# Patient Record
Sex: Male | Born: 1995 | Race: Black or African American | Hispanic: No | Marital: Single | Smoking: Current every day smoker
Health system: Southern US, Community
[De-identification: ages and names within clinical notes are randomized; demographics above are authoritative.]

## PROBLEM LIST (undated history)

## (undated) ENCOUNTER — Ambulatory Visit (HOSPITAL_COMMUNITY): Payer: Self-pay

## (undated) DIAGNOSIS — J45909 Unspecified asthma, uncomplicated: Secondary | ICD-10-CM

## (undated) HISTORY — PX: FACIAL COSMETIC SURGERY: SHX629

---
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
## 2013-07-26 ENCOUNTER — Encounter (HOSPITAL_COMMUNITY): Payer: Self-pay | Admitting: Emergency Medicine

## 2013-07-26 ENCOUNTER — Emergency Department (HOSPITAL_COMMUNITY)
Admission: EM | Admit: 2013-07-26 | Discharge: 2013-07-26 | Disposition: A | Discharge status: Home / Self Care | Payer: No Typology Code available for payment source | Attending: Emergency Medicine | Admitting: Emergency Medicine

## 2013-07-26 ENCOUNTER — Emergency Department (HOSPITAL_COMMUNITY): Payer: No Typology Code available for payment source

## 2013-07-26 DIAGNOSIS — S8990XA Unspecified injury of unspecified lower leg, initial encounter: Secondary | ICD-10-CM | POA: Insufficient documentation

## 2013-07-26 DIAGNOSIS — S335XXA Sprain of ligaments of lumbar spine, initial encounter: Secondary | ICD-10-CM | POA: Insufficient documentation

## 2013-07-26 DIAGNOSIS — S39012A Strain of muscle, fascia and tendon of lower back, initial encounter: Secondary | ICD-10-CM

## 2013-07-26 DIAGNOSIS — S99929A Unspecified injury of unspecified foot, initial encounter: Secondary | ICD-10-CM

## 2013-07-26 DIAGNOSIS — S99919A Unspecified injury of unspecified ankle, initial encounter: Secondary | ICD-10-CM

## 2013-07-26 DIAGNOSIS — Y92410 Unspecified street and highway as the place of occurrence of the external cause: Secondary | ICD-10-CM | POA: Insufficient documentation

## 2013-07-26 DIAGNOSIS — Y9389 Activity, other specified: Secondary | ICD-10-CM | POA: Insufficient documentation

## 2013-07-26 DIAGNOSIS — S0993XA Unspecified injury of face, initial encounter: Secondary | ICD-10-CM | POA: Insufficient documentation

## 2013-07-26 DIAGNOSIS — S0990XA Unspecified injury of head, initial encounter: Secondary | ICD-10-CM | POA: Insufficient documentation

## 2013-07-26 DIAGNOSIS — S199XXA Unspecified injury of neck, initial encounter: Secondary | ICD-10-CM

## 2013-07-26 MED ORDER — HYDROMORPHONE HCL PF 1 MG/ML IJ SOLN
1.0000 mg | INTRAMUSCULAR | Status: AC
  Administered 2013-07-26: 1 mg via INTRAVENOUS
  Filled 2013-07-26: qty 1

## 2013-07-26 MED ORDER — CYCLOBENZAPRINE HCL 10 MG PO TABS: 10.0000 mg | ORAL_TABLET | Freq: Two times a day (BID) | ORAL | Status: DC | PRN

## 2013-07-26 MED ORDER — OXYCODONE-ACETAMINOPHEN 5-325 MG PO TABS: 2.0000 | ORAL_TABLET | Freq: Four times a day (QID) | ORAL | Status: DC | PRN
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
## 2013-07-26 NOTE — Discharge Instructions (Signed)
Back Pain, Adult Low back pain is very common. About 1 in 5 people have back pain.The cause of low back pain is rarely dangerous. The pain often gets better over time.About half of people with a sudden onset of back pain feel better in just 2 weeks. About 8 in 10 people feel better by 6 weeks.  CAUSES Some common causes of back pain include:  Strain of the muscles or ligaments supporting the spine.  Wear and tear (degeneration) of the spinal discs.  Arthritis.  Direct injury to the back. DIAGNOSIS Most of the time, the direct cause of low back pain is not known.However, back pain can be treated effectively even when the exact cause of the pain is unknown.Answering your caregiver's questions about your overall health and symptoms is one of the most accurate ways to make sure the cause of your pain is not dangerous. If your caregiver needs more information, he or she may order lab work or imaging tests (X-rays or MRIs).However, even if imaging tests show changes in your back, this usually does not require surgery. HOME CARE INSTRUCTIONS For many people, back pain returns.Since low back pain is rarely dangerous, it is often a condition that people can learn to manageon their own.   Remain active. It is stressful on the back to sit or stand in one place. Do not sit, drive, or stand in one place for more than 30 minutes at a time. Take short walks on level surfaces as soon as pain allows.Try to increase the length of time you walk each day.  Do not stay in bed.Resting more than 1 or 2 days can delay your recovery.  Do not avoid exercise or work.Your body is made to move.It is not dangerous to be active, even though your back may hurt.Your back will likely heal faster if you return to being active before your pain is gone.  Pay attention to your body when you bend and lift. Many people have less discomfortwhen lifting if they bend their knees, keep the load close to their bodies,and  avoid twisting. Often, the most comfortable positions are those that put less stress on your recovering back.  Find a comfortable position to sleep. Use a firm mattress and lie on your side with your knees slightly bent. If you lie on your back, put a pillow under your knees.  Only take over-the-counter or prescription medicines as directed by your caregiver. Over-the-counter medicines to reduce pain and inflammation are often the most helpful.Your caregiver may prescribe muscle relaxant drugs.These medicines help dull your pain so you can more quickly return to your normal activities and healthy exercise.  Put ice on the injured area.  Put ice in a plastic bag.  Place a towel between your skin and the bag.  Leave the ice on for 15-20 minutes, 03-04 times a day for the first 2 to 3 days. After that, ice and heat may be alternated to reduce pain and spasms.  Ask your caregiver about trying back exercises and gentle massage. This may be of some benefit.  Avoid feeling anxious or stressed.Stress increases muscle tension and can worsen back pain.It is important to recognize when you are anxious or stressed and learn ways to manage it.Exercise is a great option. SEEK MEDICAL CARE IF:  You have pain that is not relieved with rest or medicine.  You have pain that does not improve in 1 week.  You have new symptoms.  You are generally not feeling well. SEEK   IMMEDIATE MEDICAL CARE IF:   You have pain that radiates from your back into your legs.  You develop new bowel or bladder control problems.  You have unusual weakness or numbness in your arms or legs.  You develop nausea or vomiting.  You develop abdominal pain.  You feel faint. Document Released: 12/22/2004 Document Revised: 06/23/2011 Document Reviewed: 04/25/2013 Exit[employer] Patient Information 2015 [employer]. This information is not intended to replace advice given to you by your health care provider. Make sure you  discuss any questions you have with your health care provider.  
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
## 2015-02-28 ENCOUNTER — Encounter (HOSPITAL_COMMUNITY): Payer: Self-pay | Admitting: *Deleted

## 2015-02-28 ENCOUNTER — Emergency Department (HOSPITAL_COMMUNITY)
Admission: EM | Admit: 2015-02-28 | Discharge: 2015-02-28 | Disposition: A | Discharge status: Home / Self Care | Payer: No Typology Code available for payment source | Attending: Emergency Medicine | Admitting: Emergency Medicine

## 2015-02-28 DIAGNOSIS — R05 Cough: Secondary | ICD-10-CM | POA: Insufficient documentation

## 2015-02-28 DIAGNOSIS — R110 Nausea: Secondary | ICD-10-CM | POA: Insufficient documentation

## 2015-02-28 DIAGNOSIS — R6889 Other general symptoms and signs: Secondary | ICD-10-CM

## 2015-02-28 DIAGNOSIS — F17200 Nicotine dependence, unspecified, uncomplicated: Secondary | ICD-10-CM | POA: Insufficient documentation

## 2015-02-28 DIAGNOSIS — R0981 Nasal congestion: Secondary | ICD-10-CM | POA: Insufficient documentation

## 2015-02-28 DIAGNOSIS — J029 Acute pharyngitis, unspecified: Secondary | ICD-10-CM | POA: Insufficient documentation

## 2015-02-28 DIAGNOSIS — M791 Myalgia: Secondary | ICD-10-CM | POA: Insufficient documentation

## 2015-02-28 LAB — RAPID STREP SCREEN (MED CTR MEBANE ONLY): STREPTOCOCCUS, GROUP A SCREEN (DIRECT): NEGATIVE

## 2015-02-28 MED ORDER — ONDANSETRON HCL 4 MG PO TABS: 4.0000 mg | ORAL_TABLET | Freq: Four times a day (QID) | ORAL | Status: DC

## 2015-02-28 NOTE — ED Notes (Signed)
Pt complains of generalized body aches, sore throat and cough since yesterday. Pt denies n/v/d. Pt states he took tylenol at 945PM.

## 2015-02-28 NOTE — ED Provider Notes (Signed)
History  By signing my name below, I, Louis Smith, attest that this documentation has been prepared under the direction and in the presence of Rob [place], [place]. Electronically Signed: Karle Smith, ED Scribe. 02/28/2015. 10:57 PM.  Chief Complaint  Patient presents with  . Sore Throat  . Generalized Body Aches   The history is provided by the patient and medical records. No language interpreter was used.    HPI Comments:  Louis Smith is a 20 y.o. male who presents to the Emergency Department complaining of congestion, sore throat and generalized body aches that began yesterday. He reports associated nausea. He has taken Mucinex and Tylenol for the symptoms with minimal relief. He denies modifying factors. He denies fever, chills, vomiting, abdominal pain, diarrhea.   History reviewed. No pertinent past medical history. History reviewed. No pertinent past surgical history. No family history on file. Social History  Substance Use Topics  . Smoking status: Current Every Day Smoker  . Smokeless tobacco: None  . Alcohol Use: Yes    Review of Systems  Constitutional: Negative for fever and chills.  HENT: Positive for sore throat.   Respiratory: Positive for cough.   Gastrointestinal: Positive for nausea. Negative for vomiting, abdominal pain and diarrhea.  Musculoskeletal: Positive for myalgias.    Allergies  Review of patient's allergies indicates no known allergies.  Home Medications   Prior to Admission medications   Medication Sig Start Date End Date Taking? Authorizing Provider  cyclobenzaprine (FLEXERIL) 10 MG tablet Take 1 tablet (10 mg total) by mouth 2 (two) times daily as needed for muscle spasms. 07/26/13   Purvis Sheffield, MD  oxyCODONE-acetaminophen (PERCOCET) 5-325 MG per tablet Take 2 tablets by mouth every 6 (six) hours as needed for moderate pain. 07/26/13   Purvis Sheffield, MD   Triage Vitals: BP 143/70 mmHg  Pulse 101  Temp(Src) 98.2 F (36.8  C) (Oral)  Resp 18  SpO2 98% Physical Exam  Physical Exam  Constitutional: Pt  is oriented to person, place, and time. Appears well-developed and well-nourished. No distress.  HENT:  Head: Normocephalic and atraumatic.  Right Ear: Tympanic membrane, external ear and ear canal normal.  Left Ear: Tympanic membrane, external ear and ear canal normal.  Nose: Mucosal edema and moderate rhinorrhea present. No epistaxis. Right sinus exhibits no maxillary sinus tenderness and no frontal sinus tenderness. Left sinus exhibits no maxillary sinus tenderness and no frontal sinus tenderness.  Mouth/Throat: Uvula is midline and mucous membranes are normal. Mucous membranes are not pale and not cyanotic. No oropharyngeal exudate, posterior oropharyngeal edema, posterior oropharyngeal erythema or tonsillar abscesses.  Eyes: Conjunctivae are normal. Pupils are equal, round, and reactive to light.  Neck: Normal range of motion and full passive range of motion without pain.  Cardiovascular: Normal rate and intact distal pulses.   Pulmonary/Chest: Effort normal and breath sounds normal. No stridor.  Clear and equal breath sounds without focal wheezes, rhonchi, rales  Abdominal: Soft. Bowel sounds are normal. There is no tenderness.  Musculoskeletal: Normal range of motion.  Lymphadenopathy:    Pthas no cervical adenopathy.  Neurological: Pt is alert and oriented to person, place, and time.  Skin: Skin is warm and dry. No rash noted. Pt is not diaphoretic.  Psychiatric: Normal mood and affect.  Nursing note and vitals reviewed.    ED Course  Procedures (including critical care time) DIAGNOSTIC STUDIES: Oxygen Saturation is 98% on RA, normal by my interpretation.   COORDINATION OF CARE: 10:55 PM- Offered Tamiflu prescription  but pt declined. Will prescribe Zofran and encouraged pt to continue taking the same OTC meds he has been taking. Encouraged pt to increase fluid intake. Pt verbalizes understanding  and agrees to plan.  Medications - No data to display  Labs Review Labs Reviewed  RAPID STREP SCREEN (NOT AT [facility])  CULTURE, GROUP A STREP [facility])    MDM   Final diagnoses:  Flu-like symptoms    Patient with symptoms consistent with influenza.  Vitals are stable, low-grade fever.  No signs of dehydration, tolerating PO's.  Lungs are clear. Due to patient's presentation and physical exam a chest x-ray was not ordered bc likely diagnosis of flu.  Patient will be discharged with instructions to orally hydrate, rest, and use over-the-counter medications such as anti-inflammatories ibuprofen and Aleve for muscle aches and Tylenol for fever.   I personally performed the services described in this documentation, which was scribed in my presence. The recorded information has been reviewed and is accurate.       Roxy Horseman, PA-C 02/28/15 2318  Tilden Fossa, MD 03/01/15 [phone]

## 2015-02-28 NOTE — Discharge Instructions (Signed)

## 2015-03-03 LAB — CULTURE, GROUP A STREP (THRC)

## 2016-01-07 ENCOUNTER — Emergency Department (HOSPITAL_COMMUNITY)
Admission: EM | Admit: 2016-01-07 | Discharge: 2016-01-07 | Disposition: A | Discharge status: Home / Self Care | Payer: Self-pay | Attending: Emergency Medicine | Admitting: Emergency Medicine

## 2016-01-07 ENCOUNTER — Encounter (HOSPITAL_COMMUNITY): Payer: Self-pay | Admitting: Emergency Medicine

## 2016-01-07 DIAGNOSIS — J029 Acute pharyngitis, unspecified: Secondary | ICD-10-CM | POA: Insufficient documentation

## 2016-01-07 DIAGNOSIS — J45909 Unspecified asthma, uncomplicated: Secondary | ICD-10-CM | POA: Insufficient documentation

## 2016-01-07 DIAGNOSIS — F17290 Nicotine dependence, other tobacco product, uncomplicated: Secondary | ICD-10-CM | POA: Insufficient documentation

## 2016-01-07 HISTORY — DX: Unspecified asthma, uncomplicated: J45.909

## 2016-01-07 MED ORDER — PENICILLIN G BENZATHINE 1200000 UNIT/2ML IM SUSP
1.2000 10*6.[IU] | Freq: Once | INTRAMUSCULAR | Status: AC
  Administered 2016-01-07: 1.2 10*6.[IU] via INTRAMUSCULAR
  Filled 2016-01-07: qty 2

## 2016-01-07 NOTE — ED Provider Notes (Signed)
[facility]-EMERGENCY DEPT Provider Note    By signing my name below, I, Earmon Phoenix, attest that this documentation has been prepared under the direction and in the presence of Sharilyn Sites, PA-C. Electronically Signed: Earmon Phoenix, ED Scribe. 01/07/16. 4:46 PM.    History   Chief Complaint Chief Complaint  Patient presents with  . Sore Throat  . Nasal Congestion   The history is provided by the patient and medical records. No language interpreter was used.    HPI Comments:  Louis Smith is a 21 y.o. male with PMHx of asthma who presents to the Emergency Department complaining of sore throat that began three days ago. He reports associated nasal congestion and sinus pressure. He reports one episode of emesis earlier today. He has has not taken anything to treat his symptoms. Swallowing increases his throat pain. Pt denies alleviating factors. He denies any known sick contacts. He denies fever, chills, nausea, difficulty breathing or swallowing.   Past Medical History:  Diagnosis Date  . Asthma     There are no active problems to display for this patient.   History reviewed. No pertinent surgical history.     Home Medications    Prior to Admission medications   Medication Sig Start Date End Date Taking? Authorizing Provider  cyclobenzaprine (FLEXERIL) 10 MG tablet Take 1 tablet (10 mg total) by mouth 2 (two) times daily as needed for muscle spasms. 07/26/13   Purvis Sheffield, MD  ondansetron (ZOFRAN) 4 MG tablet Take 1 tablet (4 mg total) by mouth every 6 (six) hours. 02/28/15   Roxy Horseman, PA-C  oxyCODONE-acetaminophen (PERCOCET) 5-325 MG per tablet Take 2 tablets by mouth every 6 (six) hours as needed for moderate pain. 07/26/13   Purvis Sheffield, MD    Family History History reviewed. No pertinent family history.  Social History Social History  Substance Use Topics  . Smoking status: Current Every Day Smoker    Types: Cigars  . Smokeless tobacco: Never  Used     Comment: 1 black and mild per day  . Alcohol use Yes     Comment: socially     Allergies   Patient has no known allergies.   Review of Systems Review of Systems  Constitutional: Negative for chills and fever.  HENT: Positive for congestion, sinus pressure and sore throat. Negative for trouble swallowing.   Respiratory: Negative for shortness of breath.   Gastrointestinal: Positive for vomiting. Negative for nausea.  All other systems reviewed and are negative.    Physical Exam Updated Vital Signs BP (!) 139/51 (BP Location: Left Arm)   Pulse 61   Temp 98.3 F (36.8 C) (Oral)   Resp 18   Wt 189 lb (85.7 kg)   SpO2 99%   Physical Exam  Constitutional: He is oriented to person, place, and time. He appears well-developed and well-nourished.  HENT:  Head: Normocephalic and atraumatic.  Right Ear: Tympanic membrane normal.  Left Ear: Tympanic membrane normal.  Nose: Nose normal.  Mouth/Throat: Uvula is midline, oropharynx is clear and moist and mucous membranes are normal.  Tonsils 1+ bilaterally with small exudates noted; uvula midline without evidence of peritonsillar abscess; handling secretions appropriately; no difficulty swallowing or speaking; normal phonation without stridor  Eyes: Conjunctivae and EOM are normal. Pupils are equal, round, and reactive to light.  Neck: Normal range of motion.  Cardiovascular: Normal rate, regular rhythm and normal heart sounds.   Pulmonary/Chest: Effort normal and breath sounds normal.  Abdominal: Soft. Bowel sounds  are normal.  Musculoskeletal: Normal range of motion.  Neurological: He is alert and oriented to person, place, and time.  Skin: Skin is warm and dry.  Psychiatric: He has a normal mood and affect.  Nursing note and vitals reviewed.    ED Treatments / Results  DIAGNOSTIC STUDIES: Oxygen Saturation is 99% on RA, normal by my interpretation.   COORDINATION OF CARE: 4:28 PM- Will order injection of Bicillin  LA prior to discharge. Return precautions discussed. Pt verbalizes understanding and agrees to plan.  Medications  penicillin g benzathine (BICILLIN LA) 1200000 UNIT/2ML injection 1.2 Million Units (not administered)    Labs (all labs ordered are listed, but only abnormal results are displayed) Labs Reviewed - No data to display  EKG  EKG Interpretation None       Radiology No results found.  Procedures Procedures (including critical care time)  Medications Ordered in ED Medications  penicillin g benzathine (BICILLIN LA) 1200000 UNIT/2ML injection 1.2 Million Units (1.2 Million Units Intramuscular Given 01/07/16 1656)     Initial Impression / Assessment and Plan / ED Course  I have reviewed the triage vital signs and the nursing notes.  Pertinent labs & imaging results that were available during my care of the patient were reviewed by me and considered in my medical decision making (see chart for details).  Clinical Course    21-year-old male here with sore throat and nasal congestion for the past several days. He is afebrile and nontoxic. Tonsils are enlarged bilaterally with exudates noted. Remainder of exam is overall benign. His is handling secretions well, normal phonation without stridor. No evidence of peritonsillar abscess at this time.  Patient treated here with Bicillin. Will discharge home with supportive care.  Follow-up with PCP.  Discussed plan with patient, he acknowledged understanding and agreed with plan of care.  Return precautions given for new or worsening symptoms.  I personally performed the services described in this documentation, which was scribed in my presence. The recorded information has been reviewed and is accurate.  Final Clinical Impressions(s) / ED Diagnoses   Final diagnoses:  Sore throat    New Prescriptions Discharge Medication List as of 01/07/2016  5:39 PM       Garlon HatchetLisa M Jaretzi Droz, PA-C 01/07/16 1755    Raeford RazorStephen Kohut, MD 01/09/16  1356

## 2016-01-07 NOTE — ED Triage Notes (Signed)
Pt c/o cough, sore throat, chills, frontal headache when coughing or sneezing x3 days. Pt tried taking mucinex w/ minimal relief. No flu shot per pt.

## 2016-01-07 NOTE — Discharge Instructions (Signed)
You have been treated for possible strep throat. May use over the counter medications if congestion type symptoms persist. Follow-up with your primary care doctor. Return here for new concerns.

## 2016-03-03 ENCOUNTER — Emergency Department (HOSPITAL_COMMUNITY)
Admission: EM | Admit: 2016-03-03 | Discharge: 2016-03-03 | Disposition: A | Discharge status: Home / Self Care | Payer: Self-pay | Attending: Emergency Medicine | Admitting: Emergency Medicine

## 2016-03-03 ENCOUNTER — Encounter (HOSPITAL_COMMUNITY): Payer: Self-pay | Admitting: Emergency Medicine

## 2016-03-03 DIAGNOSIS — F17290 Nicotine dependence, other tobacco product, uncomplicated: Secondary | ICD-10-CM | POA: Insufficient documentation

## 2016-03-03 DIAGNOSIS — J302 Other seasonal allergic rhinitis: Secondary | ICD-10-CM | POA: Insufficient documentation

## 2016-03-03 MED ORDER — BENZONATATE 100 MG PO CAPS: 100.0000 mg | ORAL_CAPSULE | Freq: Three times a day (TID) | ORAL | 0 refills | Status: DC

## 2016-03-03 NOTE — ED Triage Notes (Signed)
Patient c/o allergies that he gets every year. Nasal congestion, coughing snot.

## 2016-03-03 NOTE — Discharge Instructions (Signed)
You have been seen for symptoms that may be due to seasonal allergies or a virus.  Hydration: Symptoms will be intensified and complicated by dehydration. Dehydration can also extend the duration of symptoms. Drink plenty of fluids and get plenty of rest. You should be drinking at least half a liter of water an hour to stay hydrated. Electrolyte drinks are also encouraged. You should be drinking enough fluids to make your urine light yellow, almost clear. If this is not the case, you are not drinking enough water. Please note that some of the treatments indicated below will not be effective if you are not adequately hydrated. Pain or fever: Ibuprofen, Naproxen, or Tylenol for pain or fever.  Cough: Use the Tessalon for cough.  Congestion: Plain Mucinex may help relieve congestion. Saline sinus rinses and saline nasal sprays may also help relieve congestion. May also begin using medications like Claritin or Zyrtec daily. Flonase nasal spray is also recommended. These are all available over the counter. Sore throat: Warm liquids or Chloraseptic spray may help soothe a sore throat. Gargle twice a day with a salt water solution made from a half teaspoon of salt in a cup of warm water.  Follow up: Follow up with a primary care provider, as needed, for any future management of this issue.

## 2016-03-03 NOTE — ED Provider Notes (Signed)
[facility]-EMERGENCY DEPT Provider Note   CSN: [id number] Arrival date & time: 03/03/16  1159     History   Chief Complaint Chief Complaint  Patient presents with  . Allergies    HPI Louis Smith is a 21 y.o. male.  HPI   Louis Smith is a 21 y.o. male, with a history of Asthma, presenting to the ED with Nasal congestion, intermittently productive cough with clear sputum, and sneezing for the past couple weeks. Patient states that he gets similar symptoms every year around this time. He has not tried any medications or other treatments for his symptoms. Denies fever/chills, N/V/D, shortness of breath, sore throat, facial pain, or any other complaints.      Past Medical History:  Diagnosis Date  . Asthma     There are no active problems to display for this patient.   History reviewed. No pertinent surgical history.     Home Medications    Prior to Admission medications   Medication Sig Start Date End Date Taking? Authorizing Provider  benzonatate (TESSALON) 100 MG capsule Take 1 capsule (100 mg total) by mouth every 8 (eight) hours. 03/03/16   Shawn C Joy, PA-C  cyclobenzaprine (FLEXERIL) 10 MG tablet Take 1 tablet (10 mg total) by mouth 2 (two) times daily as needed for muscle spasms. 07/26/13   Purvis Sheffield, MD  ondansetron (ZOFRAN) 4 MG tablet Take 1 tablet (4 mg total) by mouth every 6 (six) hours. 02/28/15   Roxy Horseman, PA-C  oxyCODONE-acetaminophen (PERCOCET) 5-325 MG per tablet Take 2 tablets by mouth every 6 (six) hours as needed for moderate pain. 07/26/13   Purvis Sheffield, MD    Family History No family history on file.  Social History Social History  Substance Use Topics  . Smoking status: Current Every Day Smoker    Types: Cigars  . Smokeless tobacco: Never Used     Comment: 1 black and mild per day  . Alcohol use Yes     Comment: socially     Allergies   Patient has no known allergies.   Review of Systems Review of Systems    Constitutional: Negative for chills and fever.  HENT: Positive for congestion, rhinorrhea and sneezing. Negative for facial swelling, sinus pain, sore throat, trouble swallowing and voice change.   Respiratory: Positive for cough. Negative for shortness of breath and wheezing.   Cardiovascular: Negative for chest pain.  Gastrointestinal: Negative for abdominal pain, diarrhea, nausea and vomiting.  All other systems reviewed and are negative.    Physical Exam Updated Vital Signs BP (!) 122/110 (BP Location: Right Arm)   Pulse 89   Temp 98.3 F (36.8 C) (Oral)   Resp 16   Ht 6\' 3"  (1.905 m)   Wt 106.7 kg   SpO2 99%   BMI 29.39 kg/m   Physical Exam  Constitutional: He appears well-developed and well-nourished. No distress.  HENT:  Head: Normocephalic and atraumatic.  Nose: Mucosal edema and rhinorrhea present. Right sinus exhibits no maxillary sinus tenderness and no frontal sinus tenderness. Left sinus exhibits no maxillary sinus tenderness and no frontal sinus tenderness.  Mouth/Throat: Oropharynx is clear and moist and mucous membranes are normal.  Eyes: Conjunctivae are normal.  Neck: Neck supple.  Cardiovascular: Normal rate, regular rhythm, normal heart sounds and intact distal pulses.   Pulmonary/Chest: Effort normal and breath sounds normal. No respiratory distress.  Abdominal: Soft. There is no tenderness. There is no guarding.  Musculoskeletal: He exhibits no edema.  Lymphadenopathy:    He has no cervical adenopathy.  Neurological: He is alert.  Skin: Skin is warm and dry. He is not diaphoretic.  Psychiatric: He has a normal mood and affect. His behavior is normal.  Nursing note and vitals reviewed.    ED Treatments / Results  Labs (all labs ordered are listed, but only abnormal results are displayed) Labs Reviewed - No data to display  EKG  EKG Interpretation None       Radiology No results found.  Procedures Procedures (including critical care  time)  Medications Ordered in ED Medications - No data to display   Initial Impression / Assessment and Plan / ED Course  I have reviewed the triage vital signs and the nursing notes.  Pertinent labs & imaging results that were available during my care of the patient were reviewed by me and considered in my medical decision making (see chart for details).      Patient presents with symptoms consistent with seasonal allergies versus concurrent URI. No signs or symptoms that give suspicion for acute bacterial infection. Home management discussed, including recommendations for OTC medications. PCP follow-up recommended. Resources given. Patient voices understanding of all instructions and is comfortable with discharge.     Final Clinical Impressions(s) / ED Diagnoses   Final diagnoses:  Chronic seasonal allergic rhinitis, unspecified trigger    New Prescriptions New Prescriptions   BENZONATATE (TESSALON) 100 MG CAPSULE    Take 1 capsule (100 mg total) by mouth every 8 (eight) hours.     Anselm PancoastShawn C Joy, PA-C 03/03/16 1319    Lorre NickAnthony Allen, MD 03/04/16 1308

## 2017-05-14 ENCOUNTER — Emergency Department (HOSPITAL_COMMUNITY): Payer: Self-pay

## 2017-05-14 ENCOUNTER — Emergency Department (HOSPITAL_COMMUNITY)
Admission: EM | Admit: 2017-05-14 | Discharge: 2017-05-14 | Disposition: A | Discharge status: Home / Self Care | Payer: Self-pay | Attending: Emergency Medicine | Admitting: Emergency Medicine

## 2017-05-14 ENCOUNTER — Encounter (HOSPITAL_COMMUNITY): Payer: Self-pay | Admitting: Emergency Medicine

## 2017-05-14 DIAGNOSIS — J029 Acute pharyngitis, unspecified: Secondary | ICD-10-CM | POA: Insufficient documentation

## 2017-05-14 DIAGNOSIS — B9789 Other viral agents as the cause of diseases classified elsewhere: Secondary | ICD-10-CM | POA: Insufficient documentation

## 2017-05-14 DIAGNOSIS — R0981 Nasal congestion: Secondary | ICD-10-CM | POA: Insufficient documentation

## 2017-05-14 DIAGNOSIS — J069 Acute upper respiratory infection, unspecified: Secondary | ICD-10-CM | POA: Insufficient documentation

## 2017-05-14 DIAGNOSIS — F17290 Nicotine dependence, other tobacco product, uncomplicated: Secondary | ICD-10-CM | POA: Insufficient documentation

## 2017-05-14 LAB — GROUP A STREP BY PCR: GROUP A STREP BY PCR: NOT DETECTED

## 2017-05-14 MED ORDER — BENZONATATE 100 MG PO CAPS: 100.0000 mg | ORAL_CAPSULE | Freq: Three times a day (TID) | ORAL | 0 refills | Status: DC

## 2017-05-14 MED ORDER — MONTELUKAST SODIUM 5 MG PO CHEW: 5.0000 mg | CHEWABLE_TABLET | Freq: Every day | ORAL | 0 refills | Status: DC

## 2017-05-14 MED ORDER — CHLORHEXIDINE GLUCONATE 0.12 % MT SOLN
15.0000 mL | Freq: Two times a day (BID) | OROMUCOSAL | 0 refills | Status: DC
Start: 2017-05-14 — End: 2018-03-18

## 2017-05-14 NOTE — Discharge Instructions (Addendum)
Your strep test and your chest xray are normal.  This is likely a viral infection.  Take medications prescribed as needed.

## 2017-05-14 NOTE — ED Provider Notes (Signed)
[facility]-EMERGENCY DEPT Provider Note   CSN: [id number] Arrival date & time: 05/14/17  1451     History   Chief Complaint Chief Complaint  Patient presents with  . Sore Throat  . Cough    HPI Louis Smith is a 22 y.o. male.  HPI   22 year old male presenting for evaluation of sore throat and cough.  Patient report for the past 5 days he has had sinus congestion, throat irritation, enlarged lymph node in his neck, and cough occasionally productive with yellow sputum.  Symptoms waxing waning, no associated fever, severe headache, shortness of breath, body aches or chills.  His kids recently got over the flu.  At home using over-the-counter medication such as throat spray and Tylenol with some improvement.  Past Medical History:  Diagnosis Date  . Asthma     There are no active problems to display for this patient.   History reviewed. No pertinent surgical history.      Home Medications    Prior to Admission medications   Medication Sig Start Date End Date Taking? Authorizing Provider  acetaminophen (TYLENOL) 325 MG tablet Take 650 mg by mouth every 6 (six) hours as needed for mild pain.   Yes [provider]    Family History No family history on file.  Social History Social History   Tobacco Use  . Smoking status: Current Every Day Smoker    Types: Cigars  . Smokeless tobacco: Never Used  . Tobacco comment: 1 black and mild per day  Substance Use Topics  . Alcohol use: Yes    Comment: socially  . Drug use: Yes    Types: Marijuana     Allergies   Patient has no known allergies.   Review of Systems Review of Systems  All other systems reviewed and are negative.    Physical Exam Updated Vital Signs BP (!) 118/97 (BP Location: Left Arm)   Pulse 96   Temp 98.8 F (37.1 C) (Oral)   Resp 18   SpO2 97%   Physical Exam  Constitutional: He appears well-developed and well-nourished. No distress.  HENT:  Head:  Atraumatic.  Ears: TMs normal bilaterally Nose: Mild rhinorrhea Throat: Uvula midline no tonsillar enlargement or exudates, no trismus  Eyes: Conjunctivae are normal.  Neck: Neck supple.  Cardiovascular: Normal rate and regular rhythm.  Pulmonary/Chest: Effort normal and breath sounds normal. No stridor. He has no wheezes. He has no rhonchi. He has no rales.  Abdominal: Soft. There is no tenderness.  Lymphadenopathy:    He has no cervical adenopathy.  Neurological: He is alert.  Skin: No rash noted.  Psychiatric: He has a normal mood and affect.  Nursing note and vitals reviewed.    ED Treatments / Results  Labs (all labs ordered are listed, but only abnormal results are displayed) Labs Reviewed  GROUP A STREP BY PCR    EKG None  Radiology Dg Chest 2 View  Result Date: 05/14/2017 CLINICAL DATA:  Cough EXAM: CHEST - 2 VIEW COMPARISON:  Chest radiograph 07/26/2013 FINDINGS: The heart size and mediastinal contours are within normal limits. Both lungs are clear. The visualized skeletal structures are unremarkable. IMPRESSION: No active cardiopulmonary disease. Electronically Signed   By: Deatra Robinson M.D.   On: 05/14/2017 15:32    Procedures Procedures (including critical care time)  Medications Ordered in ED Medications - No data to display   Initial Impression / Assessment and Plan / ED Course  I have reviewed  the triage vital signs and the nursing notes.  Pertinent labs & imaging results that were available during my care of the patient were reviewed by me and considered in my medical decision making (see chart for details).     BP (!) 118/97 (BP Location: Left Arm)   Pulse 96   Temp 98.8 F (37.1 C) (Oral)   Resp 18   SpO2 97%    Final Clinical Impressions(s) / ED Diagnoses   Final diagnoses:  Viral upper respiratory tract infection    ED Discharge Orders        Ordered    chlorhexidine (PERIDEX) 0.12 % solution  2 times daily     05/14/17 1800     benzonatate (TESSALON) 100 MG capsule  Every 8 hours     05/14/17 1800    montelukast (SINGULAIR) 5 MG chewable tablet  Daily at bedtime     05/14/17 1800     Pt symptoms consistent with URI. CXR negative for acute infiltrate. Pt will be discharged with symptomatic treatment.  Discussed return precautions.  Pt is hemodynamically stable & in NAD prior to discharge.    Fayrene Helper, PA-C 05/14/17 1801    Charlynne Pander, MD 05/14/17 [id number]

## 2017-05-14 NOTE — ED Triage Notes (Signed)
Pt c/o sore throat and cough that is productive at times with yellow sputum sometimes with little blood in it. Reports his twins has strep throat.

## 2017-05-14 NOTE — ED Notes (Signed)
Pt states he left work to come here and wants a note so he can leave now and go pick up his kids.

## 2017-06-06 ENCOUNTER — Encounter (HOSPITAL_COMMUNITY): Payer: Self-pay | Admitting: Family Medicine

## 2017-06-06 ENCOUNTER — Ambulatory Visit (HOSPITAL_COMMUNITY)
Admission: EM | Admit: 2017-06-06 | Discharge: 2017-06-06 | Disposition: A | Discharge status: Home / Self Care | Payer: Self-pay | Attending: Emergency Medicine | Admitting: Emergency Medicine

## 2017-06-06 DIAGNOSIS — J069 Acute upper respiratory infection, unspecified: Secondary | ICD-10-CM

## 2017-06-06 DIAGNOSIS — B9789 Other viral agents as the cause of diseases classified elsewhere: Secondary | ICD-10-CM

## 2017-06-06 MED ORDER — PSEUDOEPH-BROMPHEN-DM 30-2-10 MG/5ML PO SYRP: 5.0000 mL | ORAL_SOLUTION | Freq: Four times a day (QID) | ORAL | 0 refills | Status: DC | PRN

## 2017-06-06 MED ORDER — OXYMETAZOLINE HCL 0.05 % NA SOLN: 1.0000 | Freq: Two times a day (BID) | NASAL | 0 refills | Status: DC

## 2017-06-06 MED ORDER — FLUTICASONE PROPIONATE 50 MCG/ACT NA SUSP: 1.0000 | Freq: Every day | NASAL | 0 refills | Status: DC

## 2017-06-06 MED ORDER — CETIRIZINE HCL 10 MG PO CAPS: 10.0000 mg | ORAL_CAPSULE | Freq: Every day | ORAL | 0 refills | Status: DC

## 2017-06-06 NOTE — ED Triage Notes (Signed)
Pt here for 3 days of headache, nasal congestion, coughing.

## 2017-06-06 NOTE — Discharge Instructions (Signed)
For congestion please begin daily allergy pill like Zyrtec or Claritin, or store brand.  Please also use Flonase nasal spray daily.  You may use Afrin nasal spray for 3 days, do not use more than 3 days as it may worsen congestion.  For cough please use cough syrup provided, or you may use over-the-counter Delsym, Robitussin or Robitussin-Dm.  I expect symptoms to gradually improve over the next 7 days.  Please return if symptoms worsening or not improving by the end of next weekend.

## 2017-06-07 NOTE — ED Provider Notes (Signed)
[facility]    CSN: [id number] Arrival date & time: 06/06/17  1242     History   Chief Complaint Chief Complaint  Patient presents with  . Nasal Congestion  . Cough    HPI Louis Smith is a 22 y.o. male history of asthma and tobacco use, Patient is presenting with URI symptoms- congestion, cough, sore throat.  Also having a headache and feeling like his ears are popped.  Patient's main complaints are congestion, headache. Symptoms have been going on for 1 day. Patient has tried Tylenol and Mucinex, with minimal relief. Denies fever, nausea, vomiting, diarrhea. Denies shortness of breath and chest pain.    HPI  Past Medical History:  Diagnosis Date  . Asthma     There are no active problems to display for this patient.   History reviewed. No pertinent surgical history.     Home Medications    Prior to Admission medications   Medication Sig Start Date End Date Taking? Authorizing Provider  acetaminophen (TYLENOL) 325 MG tablet Take 650 mg by mouth every 6 (six) hours as needed for mild pain.    [provider]  benzonatate (TESSALON) 100 MG capsule Take 1 capsule (100 mg total) by mouth every 8 (eight) hours. 05/14/17   Fayrene Helper, PA-C  brompheniramine-pseudoephedrine-DM 30-2-10 MG/5ML syrup Take 5 mLs by mouth 4 (four) times daily as needed. 06/06/17   Wieters, Hallie C, PA-C  Cetirizine HCl 10 MG CAPS Take 1 capsule (10 mg total) by mouth daily for 15 days. 06/06/17 06/21/17  Wieters, Hallie C, PA-C  chlorhexidine (PERIDEX) 0.12 % solution Use as directed 15 mLs in the mouth or throat 2 (two) times daily. 05/14/17   Fayrene Helper, PA-C  fluticasone (FLONASE) 50 MCG/ACT nasal spray Place 1-2 sprays into both nostrils daily for 7 days. 06/06/17 06/13/17  Wieters, Hallie C, PA-C  montelukast (SINGULAIR) 5 MG chewable tablet Chew 1 tablet (5 mg total) by mouth at bedtime. 05/14/17   Fayrene Helper, PA-C  oxymetazoline (AFRIN) 0.05 % nasal spray Place 1 spray into both  nostrils 2 (two) times daily. Do not use more than 3 days 06/06/17   Wieters, Junius Creamer, PA-C    Family History History reviewed. No pertinent family history.  Social History Social History   Tobacco Use  . Smoking status: Current Every Day Smoker    Types: Cigars  . Smokeless tobacco: Never Used  . Tobacco comment: 1 black and mild per day  Substance Use Topics  . Alcohol use: Yes    Comment: socially  . Drug use: Yes    Types: Marijuana     Allergies   Patient has no known allergies.   Review of Systems Review of Systems  Constitutional: Positive for fatigue. Negative for activity change, appetite change and fever.  HENT: Positive for congestion, ear pain, postnasal drip, rhinorrhea, sinus pressure and sore throat.   Eyes: Negative for pain and itching.  Respiratory: Positive for cough. Negative for shortness of breath.   Cardiovascular: Negative for chest pain.  Gastrointestinal: Negative for abdominal pain, diarrhea, nausea and vomiting.  Musculoskeletal: Negative for myalgias.  Skin: Negative for rash.  Neurological: Positive for headaches. Negative for dizziness and light-headedness.     Physical Exam Triage Vital Signs ED Triage Vitals  Enc Vitals Group     BP 06/06/17 1348 118/78     Pulse Rate 06/06/17 1348 70     Resp 06/06/17 1348 18     Temp 06/06/17 1348  98.5 F (36.9 C)     Temp src --      SpO2 06/06/17 1348 98 %     Weight --      Height --      Head Circumference --      Peak Flow --      Pain Score 06/06/17 1347 6     Pain Loc --      Pain Edu? --      Excl. in GC? --    No data found.  Updated Vital Signs BP 118/78   Pulse 70   Temp 98.5 F (36.9 C)   Resp 18   SpO2 98%   Visual Acuity Right Eye Distance:   Left Eye Distance:   Bilateral Distance:    Right Eye Near:   Left Eye Near:    Bilateral Near:     Physical Exam  Constitutional: He appears well-developed and well-nourished.  No acute distress, sounds congested    HENT:  Head: Normocephalic and atraumatic.  Bilateral ears without tenderness to palpation of external auricle, tragus and mastoid, EAC's without erythema or swelling, TM's with good bony landmarks and cone of light. Non erythematous.  Nasal mucosa erythematous, swollen turbinates, clear rhinorrhea present in bilateral nares  Oral mucosa pink and moist, no tonsillar enlargement or exudate. Posterior pharynx patent and erythematous, no uvula deviation or swelling. Normal phonation.   Eyes: Conjunctivae are normal.  Neck: Neck supple.  Cardiovascular: Normal rate and regular rhythm.  No murmur heard. Pulmonary/Chest: Effort normal and breath sounds normal. No respiratory distress.  Breathing comfortably at rest, CTABL, no wheezing, rales or other adventitious sounds auscultated  Abdominal: Soft. There is no tenderness.  Musculoskeletal: He exhibits no edema.  Neurological: He is alert.  Skin: Skin is warm and dry.  Psychiatric: He has a normal mood and affect.  Nursing note and vitals reviewed.    UC Treatments / Results  Labs (all labs ordered are listed, but only abnormal results are displayed) Labs Reviewed - No data to display  EKG None  Radiology No results found.  Procedures Procedures (including critical care time)  Medications Ordered in UC Medications - No data to display  Initial Impression / Assessment and Plan / UC Course  I have reviewed the triage vital signs and the nursing notes.  Pertinent labs & imaging results that were available during my care of the patient were reviewed by me and considered in my medical decision making (see chart for details).     Patient with URI symptoms, likely viral etiology.  Vital signs stable, exam unremarkable.  Will recommend to continue symptomatic management, recommendations below.Discussed strict return precautions. Patient verbalized understanding and is agreeable with plan.  Final Clinical Impressions(s) / UC  Diagnoses   Final diagnoses:  Viral URI with cough     Discharge Instructions     For congestion please begin daily allergy pill like Zyrtec or Claritin, or store brand.  Please also use Flonase nasal spray daily.  You may use Afrin nasal spray for 3 days, do not use more than 3 days as it may worsen congestion.  For cough please use cough syrup provided, or you may use over-the-counter Delsym, Robitussin or Robitussin-Dm.  I expect symptoms to gradually improve over the next 7 days.  Please return if symptoms worsening or not improving by the end of next weekend.   ED Prescriptions    Medication Sig Dispense Auth. Provider   Cetirizine HCl 10 MG CAPS  Take 1 capsule (10 mg total) by mouth daily for 15 days. 15 capsule Wieters, Hallie C, PA-C   fluticasone (FLONASE) 50 MCG/ACT nasal spray Place 1-2 sprays into both nostrils daily for 7 days. 1 g Wieters, Hallie C, PA-C   oxymetazoline (AFRIN) 0.05 % nasal spray Place 1 spray into both nostrils 2 (two) times daily. Do not use more than 3 days 30 mL Wieters, Hallie C, PA-C   brompheniramine-pseudoephedrine-DM 30-2-10 MG/5ML syrup Take 5 mLs by mouth 4 (four) times daily as needed. 120 mL Wieters, Hallie C, PA-C     Controlled Substance Prescriptions [place] Controlled Substance Registry consulted? Not Applicable   Lew DawesWieters, Hallie C, [place]PA-C 06/07/17 [id number]

## 2017-06-14 ENCOUNTER — Encounter (HOSPITAL_COMMUNITY): Payer: Self-pay

## 2017-06-14 ENCOUNTER — Emergency Department (HOSPITAL_COMMUNITY)
Admission: EM | Admit: 2017-06-14 | Discharge: 2017-06-14 | Disposition: A | Discharge status: Home / Self Care | Payer: Self-pay | Attending: Emergency Medicine | Admitting: Emergency Medicine

## 2017-06-14 ENCOUNTER — Emergency Department (HOSPITAL_COMMUNITY): Payer: Self-pay

## 2017-06-14 ENCOUNTER — Other Ambulatory Visit: Payer: Self-pay

## 2017-06-14 DIAGNOSIS — J40 Bronchitis, not specified as acute or chronic: Secondary | ICD-10-CM | POA: Insufficient documentation

## 2017-06-14 DIAGNOSIS — J4521 Mild intermittent asthma with (acute) exacerbation: Secondary | ICD-10-CM | POA: Insufficient documentation

## 2017-06-14 DIAGNOSIS — R059 Cough, unspecified: Secondary | ICD-10-CM

## 2017-06-14 DIAGNOSIS — Z79899 Other long term (current) drug therapy: Secondary | ICD-10-CM | POA: Insufficient documentation

## 2017-06-14 DIAGNOSIS — R05 Cough: Secondary | ICD-10-CM

## 2017-06-14 DIAGNOSIS — F17290 Nicotine dependence, other tobacco product, uncomplicated: Secondary | ICD-10-CM | POA: Insufficient documentation

## 2017-06-14 MED ORDER — DEXAMETHASONE SODIUM PHOSPHATE 10 MG/ML IJ SOLN
10.0000 mg | Freq: Once | INTRAMUSCULAR | Status: AC
  Administered 2017-06-14: 10 mg via INTRAMUSCULAR
  Filled 2017-06-14: qty 1

## 2017-06-14 MED ORDER — ALBUTEROL SULFATE HFA 108 (90 BASE) MCG/ACT IN AERS
2.0000 | INHALATION_SPRAY | Freq: Once | RESPIRATORY_TRACT | Status: AC
  Administered 2017-06-14: 2 via RESPIRATORY_TRACT
  Filled 2017-06-14: qty 6.7

## 2017-06-14 NOTE — ED Triage Notes (Signed)
Patient c/o a productive cough with yellow/green sputum and blood-tinged at times. X 1 week. Patient states he has not used his Albuterol inhaler because he couod not find it. Patient c/o asthma, but no wheezing in triage

## 2017-06-14 NOTE — Discharge Instructions (Signed)
Your symptoms are likely caused by a viral upper respiratory infection or postinfectious reactive bronchitis. Antibiotics are not helpful in treating viral infection, cough can persist for even a month after the rest of viral symptoms seem to improve. Please make sure you are drinking plenty of fluids. You can treat your symptoms supportively with tylenol/ibuprofen for fevers and pains, Zyrtec and Flonase to heal with nasal congestion, and over the counter cough syrups and throat lozenges to help with cough.  Please use your inhaler as needed for cough or wheezing, you were given a shot of steroids which should continue to help over the next few days.  If your symptoms are not improving please follow up with you Primary doctor.   If you develop persistent fevers, shortness of breath or difficulty breathing, chest pain, severe headache and neck pain, persistent nausea and vomiting or other new or concerning symptoms return to the Emergency department.

## 2017-06-14 NOTE — ED Provider Notes (Signed)
[facility]-EMERGENCY DEPT Provider Note   CSN: [id number] Arrival date & time: 06/14/17  0913     History   Chief Complaint Chief Complaint  Patient presents with  . Asthma  . Cough    HPI Louis Smith is a 22 y.o. male.  Louis Smith is a 22 y.o. Male history of asthma, who presents the emergency department for evaluation of 1 week of productive cough with yellow-green sputum, with some congestion, rhinorrhea and sore throat which is improved.  Patient reports he was seen at urgent care about 6 days ago, and has been taking Flonase, Allegra and over-the-counter cough medications as directed.  He reports the nasal congestion, rhinorrhea and sore throat has started to improve, but he has a persistent productive cough.  He reports he works in a [employer] and has been very hot in there recently and this causes repeated coughing spasms.  He reports feeling like he is wheezing intermittently especially at work, he has an albuterol nebulizer that he uses at home but not an inhaler he can take to work with him.  He reports chest soreness when he is coughing but denies any chest pain or shortness of breath otherwise.  He denies fevers reports some occasional chills.  No abdominal pain, nausea or vomiting.     Past Medical History:  Diagnosis Date  . Asthma     There are no active problems to display for this patient.   Past Surgical History:  Procedure Laterality Date  . FACIAL COSMETIC SURGERY          Home Medications    Prior to Admission medications   Medication Sig Start Date End Date Taking? Authorizing Provider  acetaminophen (TYLENOL) 325 MG tablet Take 650 mg by mouth every 6 (six) hours as needed for mild pain.    [provider]  benzonatate (TESSALON) 100 MG capsule Take 1 capsule (100 mg total) by mouth every 8 (eight) hours. 05/14/17   Fayrene Helper, PA-C  brompheniramine-pseudoephedrine-DM 30-2-10 MG/5ML syrup Take 5 mLs by mouth 4  (four) times daily as needed. 06/06/17   Wieters, Hallie C, PA-C  Cetirizine HCl 10 MG CAPS Take 1 capsule (10 mg total) by mouth daily for 15 days. 06/06/17 06/21/17  Wieters, Hallie C, PA-C  chlorhexidine (PERIDEX) 0.12 % solution Use as directed 15 mLs in the mouth or throat 2 (two) times daily. 05/14/17   Fayrene Helper, PA-C  fluticasone (FLONASE) 50 MCG/ACT nasal spray Place 1-2 sprays into both nostrils daily for 7 days. 06/06/17 06/13/17  Wieters, Hallie C, PA-C  montelukast (SINGULAIR) 5 MG chewable tablet Chew 1 tablet (5 mg total) by mouth at bedtime. 05/14/17   Fayrene Helper, PA-C  oxymetazoline (AFRIN) 0.05 % nasal spray Place 1 spray into both nostrils 2 (two) times daily. Do not use more than 3 days 06/06/17   Wieters, Junius Creamer, PA-C    Family History History reviewed. No pertinent family history.  Social History Social History   Tobacco Use  . Smoking status: Current Every Day Smoker    Types: Cigars  . Smokeless tobacco: Never Used  . Tobacco comment: 1 black and mild per day  Substance Use Topics  . Alcohol use: Yes    Comment: socially  . Drug use: Yes    Types: Marijuana     Allergies   Patient has no known allergies.   Review of Systems Review of Systems  Constitutional: Positive for chills. Negative for fever.  HENT:  Positive for congestion, postnasal drip, rhinorrhea and sore throat. Negative for ear discharge and ear pain.   Eyes: Negative for discharge, redness and itching.  Respiratory: Positive for cough and wheezing. Negative for chest tightness and shortness of breath.   Cardiovascular: Negative for chest pain.  Gastrointestinal: Negative for abdominal pain, nausea and vomiting.  Musculoskeletal: Positive for myalgias. Negative for arthralgias, neck pain and neck stiffness.  Neurological: Negative for dizziness, syncope, light-headedness and headaches.     Physical Exam Updated Vital Signs BP 125/78 (BP Location: Left Arm)   Pulse 64   Temp 98.3 F (36.8 C)  (Oral)   Resp 18   Ht 6\' 4"  (1.93 m)   Wt 113.4 kg (250 lb)   SpO2 100%   BMI 30.43 kg/m   Physical Exam  Constitutional: He appears well-developed and well-nourished. No distress.  HENT:  Head: Normocephalic and atraumatic.  Mouth/Throat: Oropharynx is clear and moist.  TMs clear with good landmarks, moderate nasal mucosa edema with clear rhinorrhea, posterior oropharynx clear and moist, with some erythema, no edema or exudates, uvula midline  Eyes: Right eye exhibits no discharge. Left eye exhibits no discharge.  Neck: Neck supple.  No rigidity  Cardiovascular: Normal rate, regular rhythm, normal heart sounds and intact distal pulses.  Pulmonary/Chest: Effort normal and breath sounds normal. No stridor. No respiratory distress. He has no wheezes. He has no rales.  Respirations equal and unlabored, patient able to speak in full sentences, lungs clear to auscultation bilaterally, they do sound a bit tight.  Active coughing repeatedly during exam.  Abdominal: Soft. Bowel sounds are normal. He exhibits no distension and no mass. There is no tenderness. There is no guarding.  Abdomen soft, nondistended, nontender to palpation in all quadrants without guarding or peritoneal signs  Musculoskeletal: He exhibits no edema or deformity.  Lymphadenopathy:    He has no cervical adenopathy.  Neurological: He is alert. Coordination normal.  Skin: Skin is warm and dry. Capillary refill takes less than 2 seconds. He is not diaphoretic.  Psychiatric: He has a normal mood and affect. His behavior is normal.  Nursing note and vitals reviewed.    ED Treatments / Results  Labs (all labs ordered are listed, but only abnormal results are displayed) Labs Reviewed - No data to display  EKG None  Radiology Dg Chest 2 View  Result Date: 06/14/2017 CLINICAL DATA:  Shortness of breath for 1 week. Chest pain last week. EXAM: CHEST - 2 VIEW COMPARISON:  PA and lateral chest 05/14/2017. Single-view of  the chest 07/26/2013. FINDINGS: The lungs are clear. Heart size is normal. No pneumothorax or pleural fluid. No bony abnormality. IMPRESSION: Negative chest. Electronically Signed   By: Drusilla Kanner M.D.   On: 06/14/2017 12:42    Procedures Procedures (including critical care time)  Medications Ordered in ED Medications  dexamethasone (DECADRON) injection 10 mg (10 mg Intramuscular Given 06/14/17 1229)  albuterol (PROVENTIL HFA;VENTOLIN HFA) 108 (90 Base) MCG/ACT inhaler 2 puff (2 puffs Inhalation Given 06/14/17 1232)     Initial Impression / Assessment and Plan / ED Course  I have reviewed the triage vital signs and the nursing notes.  Pertinent labs & imaging results that were available during my care of the patient were reviewed by me and considered in my medical decision making (see chart for details).  Pt presents with persistent cough, was experiencing some rhinorrhea nasal congestion which seems to be improving with symptomatic treatment.. Pt is well appearing and vitals are  normal. Lungs CTA on exam, do sound a bit tight, albuterol inhaler provided as well as shot of Decadron.  Pt CXR negative for acute infiltrate. Patients symptoms are consistent with URI versus postinfectious reactive bronchitis. Discussed that antibiotics are not indicated for viral infections. Pt will be discharged with symptomatic treatment, divided albuterol inhaler to take to work with him today.  Verbalizes understanding and is agreeable with plan. Pt is hemodynamically stable & in NAD prior to dc.   Final Clinical Impressions(s) / ED Diagnoses   Final diagnoses:  Bronchitis  Mild intermittent asthma with acute exacerbation  Cough    ED Discharge Orders    None       Legrand RamsFord, Kelsey N, PA-C 06/14/17 1258    Wynetta FinesMessick, Peter C, MD 06/14/17 1544

## 2017-06-14 NOTE — ED Notes (Signed)
Bed: WTR9 Expected date:  Expected time:  Means of arrival:  Comments: 

## 2017-06-14 NOTE — ED Notes (Signed)
Pt demonstrated correct use of inhaler. Pt given d/c instructions and work note.

## 2017-10-12 ENCOUNTER — Other Ambulatory Visit: Payer: Self-pay

## 2017-10-12 ENCOUNTER — Encounter (HOSPITAL_COMMUNITY): Payer: Self-pay | Admitting: Emergency Medicine

## 2017-10-12 ENCOUNTER — Ambulatory Visit (HOSPITAL_COMMUNITY)
Admission: EM | Admit: 2017-10-12 | Discharge: 2017-10-12 | Disposition: A | Discharge status: Home / Self Care | Payer: Self-pay | Attending: Family Medicine | Admitting: Family Medicine

## 2017-10-12 DIAGNOSIS — Z113 Encounter for screening for infections with a predominantly sexual mode of transmission: Secondary | ICD-10-CM

## 2017-10-12 DIAGNOSIS — R339 Retention of urine, unspecified: Secondary | ICD-10-CM | POA: Insufficient documentation

## 2017-10-12 DIAGNOSIS — Z79899 Other long term (current) drug therapy: Secondary | ICD-10-CM | POA: Insufficient documentation

## 2017-10-12 DIAGNOSIS — Z202 Contact with and (suspected) exposure to infections with a predominantly sexual mode of transmission: Secondary | ICD-10-CM

## 2017-10-12 DIAGNOSIS — J45909 Unspecified asthma, uncomplicated: Secondary | ICD-10-CM | POA: Insufficient documentation

## 2017-10-12 DIAGNOSIS — M545 Low back pain: Secondary | ICD-10-CM

## 2017-10-12 DIAGNOSIS — R300 Dysuria: Secondary | ICD-10-CM | POA: Insufficient documentation

## 2017-10-12 DIAGNOSIS — N4889 Other specified disorders of penis: Secondary | ICD-10-CM | POA: Insufficient documentation

## 2017-10-12 DIAGNOSIS — F17290 Nicotine dependence, other tobacco product, uncomplicated: Secondary | ICD-10-CM | POA: Insufficient documentation

## 2017-10-12 LAB — POCT URINALYSIS DIP (DEVICE)
BILIRUBIN URINE: NEGATIVE
Glucose, UA: NEGATIVE mg/dL
Hgb urine dipstick: NEGATIVE
KETONES UR: NEGATIVE mg/dL
LEUKOCYTES UA: NEGATIVE
Nitrite: NEGATIVE
Protein, ur: NEGATIVE mg/dL
SPECIFIC GRAVITY, URINE: 1.025 (ref 1.005–1.030)
UROBILINOGEN UA: 0.2 mg/dL (ref 0.0–1.0)
pH: 6.5 (ref 5.0–8.0)

## 2017-10-12 MED ORDER — AZITHROMYCIN 250 MG PO TABS
ORAL_TABLET | ORAL | Status: AC
  Filled 2017-10-12: qty 4

## 2017-10-12 MED ORDER — LIDOCAINE HCL (PF) 1 % IJ SOLN
INTRAMUSCULAR | Status: AC
  Filled 2017-10-12: qty 2

## 2017-10-12 MED ORDER — CEFTRIAXONE SODIUM 250 MG IJ SOLR
INTRAMUSCULAR | Status: AC
  Filled 2017-10-12: qty 250

## 2017-10-12 MED ORDER — AZITHROMYCIN 250 MG PO TABS
1000.0000 mg | ORAL_TABLET | Freq: Once | ORAL | Status: AC
  Administered 2017-10-12: 1000 mg via ORAL

## 2017-10-12 MED ORDER — CEFTRIAXONE SODIUM 250 MG IJ SOLR
250.0000 mg | Freq: Once | INTRAMUSCULAR | Status: AC
  Administered 2017-10-12: 250 mg via INTRAMUSCULAR

## 2017-10-12 NOTE — Discharge Instructions (Addendum)
We will go ahead and treat you today for gonorrhea and chlamydia. Your lab results are pending we will call with any positive results

## 2017-10-12 NOTE — ED Triage Notes (Signed)
Urinates small amounts of urine, no burning with urination, left lower back pain.  Onset of symptoms on Friday. Patient denies discharge

## 2017-10-12 NOTE — ED Notes (Signed)
Sent to bathroom for a dirty and clean specimen with instructions 

## 2017-10-12 NOTE — ED Provider Notes (Signed)
[facility]    CSN: [id number] Arrival date & time: 10/12/17  1358     History   Chief Complaint Chief Complaint  Patient presents with  . Urinary Tract Infection    HPI Louis Smith is a 22 y.o. male.   Patient is a 22 year old male presents for urinary retention, frequency, mild penile irritation and lower back discomfort. He reports symptoms  Have been constant and remained the same. He denies any rashes, lesions, penile or testicle swelling or penile discharge. He is currently sexually active with one partner, unprotected but admits to sleeping with another partner unprotected.  He denies any fever, chills, body aches, fatigue, night sweats.  ROS per HPI      Past Medical History:  Diagnosis Date  . Asthma     There are no active problems to display for this patient.   Past Surgical History:  Procedure Laterality Date  . FACIAL COSMETIC SURGERY         Home Medications    Prior to Admission medications   Medication Sig Start Date End Date Taking? Authorizing Provider  acetaminophen (TYLENOL) 325 MG tablet Take 650 mg by mouth every 6 (six) hours as needed for mild pain.    [provider]  benzonatate (TESSALON) 100 MG capsule Take 1 capsule (100 mg total) by mouth every 8 (eight) hours. 05/14/17   Fayrene Helper, PA-C  brompheniramine-pseudoephedrine-DM 30-2-10 MG/5ML syrup Take 5 mLs by mouth 4 (four) times daily as needed. 06/06/17   Wieters, Hallie C, PA-C  Cetirizine HCl 10 MG CAPS Take 1 capsule (10 mg total) by mouth daily for 15 days. 06/06/17 06/21/17  Wieters, Hallie C, PA-C  chlorhexidine (PERIDEX) 0.12 % solution Use as directed 15 mLs in the mouth or throat 2 (two) times daily. 05/14/17   Fayrene Helper, PA-C  fluticasone (FLONASE) 50 MCG/ACT nasal spray Place 1-2 sprays into both nostrils daily for 7 days. 06/06/17 06/13/17  Wieters, Hallie C, PA-C  montelukast (SINGULAIR) 5 MG chewable tablet Chew 1 tablet (5 mg total) by mouth at bedtime.  05/14/17   Fayrene Helper, PA-C  oxymetazoline (AFRIN) 0.05 % nasal spray Place 1 spray into both nostrils 2 (two) times daily. Do not use more than 3 days 06/06/17   Wieters, Junius Creamer, PA-C    Family History History reviewed. No pertinent family history.  Social History Social History   Tobacco Use  . Smoking status: Current Every Day Smoker    Types: Cigars  . Smokeless tobacco: Never Used  . Tobacco comment: 1 black and mild per day  Substance Use Topics  . Alcohol use: Yes    Comment: socially  . Drug use: Yes    Types: Marijuana     Allergies   Patient has no known allergies.   Review of Systems Review of Systems   Physical Exam Triage Vital Signs ED Triage Vitals  Enc Vitals Group     BP 10/12/17 1452 (!) 142/72     Pulse Rate 10/12/17 1452 77     Resp 10/12/17 1452 18     Temp 10/12/17 1452 98.3 F (36.8 C)     Temp Source 10/12/17 1452 Oral     SpO2 10/12/17 1452 98 %     Weight --      Height --      Head Circumference --      Peak Flow --      Pain Score 10/12/17 1450 5     Pain  Loc --      Pain Edu? --      Excl. in GC? --    No data found.  Updated Vital Signs BP (!) 142/72 (BP Location: Right Arm)   Pulse 77   Temp 98.3 F (36.8 C) (Oral)   Resp 18   SpO2 98%   Visual Acuity Right Eye Distance:   Left Eye Distance:   Bilateral Distance:    Right Eye Near:   Left Eye Near:    Bilateral Near:     Physical Exam  Constitutional: He appears well-developed and well-nourished.  Very pleasant. Non toxic or ill appearing.  HENT:  Head: Normocephalic and atraumatic.  Eyes: Conjunctivae are normal.  Neck: Normal range of motion.  Pulmonary/Chest: Effort normal.  Abdominal: Soft.  Abdomen soft, non tender. No CVA tenderness. No rebound tenderness.  Musculoskeletal: Normal range of motion.  Neurological: He is alert.  Skin: Skin is warm and dry.  Psychiatric: He has a normal mood and affect.  Nursing note and vitals reviewed.    UC  Treatments / Results  Labs (all labs ordered are listed, but only abnormal results are displayed) Labs Reviewed  POCT URINALYSIS DIP (DEVICE)  URINE CYTOLOGY ANCILLARY ONLY    EKG None  Radiology No results found.  Procedures Procedures (including critical care time)  Medications Ordered in UC Medications  cefTRIAXone (ROCEPHIN) injection 250 mg (250 mg Intramuscular Given 10/12/17 1550)  azithromycin (ZITHROMAX) tablet 1,000 mg (1,000 mg Oral Given 10/12/17 1549)    Initial Impression / Assessment and Plan / UC Course  I have reviewed the triage vital signs and the nursing notes.  Pertinent labs & imaging results that were available during my care of the patient were reviewed by me and considered in my medical decision making (see chart for details).     Most likely STD Will treat prophylactically for gonorrhea chlamydia Lab results pending We will call with any positive results Final Clinical Impressions(s) / UC Diagnoses   Final diagnoses:  Dysuria     Discharge Instructions     We will go ahead and treat you today for gonorrhea and chlamydia. Your lab results are pending we will call with any positive results    ED Prescriptions    None     Controlled Substance Prescriptions [place] Controlled Substance Registry consulted? Not Applicable   Janace Aris, NP 10/12/17 1606

## 2017-10-13 LAB — URINE CYTOLOGY ANCILLARY ONLY
CHLAMYDIA, DNA PROBE: NEGATIVE
NEISSERIA GONORRHEA: NEGATIVE
TRICH (WINDOWPATH): NEGATIVE

## 2018-03-18 ENCOUNTER — Encounter (HOSPITAL_COMMUNITY): Payer: Self-pay | Admitting: Emergency Medicine

## 2018-03-18 ENCOUNTER — Ambulatory Visit (HOSPITAL_COMMUNITY)
Admission: EM | Admit: 2018-03-18 | Discharge: 2018-03-18 | Disposition: A | Discharge status: Home / Self Care | Payer: Self-pay | Attending: Emergency Medicine | Admitting: Emergency Medicine

## 2018-03-18 ENCOUNTER — Other Ambulatory Visit: Payer: Self-pay

## 2018-03-18 DIAGNOSIS — B9789 Other viral agents as the cause of diseases classified elsewhere: Secondary | ICD-10-CM

## 2018-03-18 DIAGNOSIS — J069 Acute upper respiratory infection, unspecified: Secondary | ICD-10-CM

## 2018-03-18 MED ORDER — CETIRIZINE-PSEUDOEPHEDRINE ER 5-120 MG PO TB12: 1.0000 | ORAL_TABLET | Freq: Every day | ORAL | 0 refills | Status: DC

## 2018-03-18 MED ORDER — BENZONATATE 100 MG PO CAPS: 100.0000 mg | ORAL_CAPSULE | Freq: Three times a day (TID) | ORAL | 0 refills | Status: DC

## 2018-03-18 MED ORDER — FLUTICASONE PROPIONATE 50 MCG/ACT NA SUSP: 2.0000 | Freq: Every day | NASAL | 0 refills | Status: DC

## 2018-03-18 NOTE — ED Provider Notes (Signed)
[facility]   [id number] 03/18/18 Arrival Time: 1433   CC: URI symptoms   SUBJECTIVE: History from: patient.  LAMON HAGA is a 23 y.o. male who presents with abrupt onset of runny nose, congestion, dry/ productive cough x 3 days.  Denies known sick exposure or precipitating event.  Has tried OTC medications.  Complains of associated sweats.  Denies fever, chills, fatigue, sinus pain, sore throat, SOB, wheezing, chest pain, nausea, changes in bowel or bladder habits.    ROS: As per HPI.  Past Medical History:  Diagnosis Date  . Asthma    Past Surgical History:  Procedure Laterality Date  . FACIAL COSMETIC SURGERY     No Known Allergies No current facility-administered medications on file prior to encounter.    Current Outpatient Medications on File Prior to Encounter  Medication Sig Dispense Refill  . acetaminophen (TYLENOL) 325 MG tablet Take 650 mg by mouth every 6 (six) hours as needed for mild pain.     Social History   Socioeconomic History  . Marital status: Single    Spouse name: Not on file  . Number of children: Not on file  . Years of education: Not on file  . Highest education level: Not on file  Occupational History  . Not on file  Social Needs  . Financial resource strain: Not on file  . Food insecurity:    Worry: Not on file    Inability: Not on file  . Transportation needs:    Medical: Not on file    Non-medical: Not on file  Tobacco Use  . Smoking status: Current Every Day Smoker    Types: Cigars  . Smokeless tobacco: Never Used  . Tobacco comment: 1 black and mild per day  Substance and Sexual Activity  . Alcohol use: Yes    Comment: socially  . Drug use: Yes    Types: Marijuana  . Sexual activity: Not on file  Lifestyle  . Physical activity:    Days per week: Not on file    Minutes per session: Not on file  . Stress: Not on file  Relationships  . Social connections:    Talks on phone: Not on file    Gets together: Not  on file    Attends religious service: Not on file    Active member of club or organization: Not on file    Attends meetings of clubs or organizations: Not on file    Relationship status: Not on file  . Intimate partner violence:    Fear of current or ex partner: Not on file    Emotionally abused: Not on file    Physically abused: Not on file    Forced sexual activity: Not on file  Other Topics Concern  . Not on file  Social History Narrative  . Not on file   History reviewed. No pertinent family history.  OBJECTIVE:  Vitals:   03/18/18 1507  BP: 128/69  Pulse: 74  Resp: 18  Temp: 98.5 F (36.9 C)  TempSrc: Temporal  SpO2: 100%     General appearance: alert; appears mildly fatigued, but nontoxic; speaking in full sentences and tolerating own secretions HEENT: NCAT; Ears: EACs clear, TMs pearly gray; Eyes: PERRL.  EOM grossly intact. Nose: nares patent with clear rhinorrhea, turbinates swollen and erythematous, Throat: oropharynx clear, tonsils non erythematous or enlarged, uvula midline  Neck: supple without LAD Lungs: unlabored respirations, symmetrical air entry; cough: mild; no respiratory distress; CTAB Heart: regular rate  and rhythm.  Radial pulses 2+ symmetrical bilaterally Skin: warm and dry Psychological: alert and cooperative; normal mood and affect  ASSESSMENT & PLAN:  1. Viral URI with cough     Meds ordered this encounter  Medications  . cetirizine-pseudoephedrine (ZYRTEC-D) 5-120 MG tablet    Sig: Take 1 tablet by mouth daily.    Dispense:  30 tablet    Refill:  0    Order Specific Question:   Supervising Provider    Answer:   Eustace Moore [[id number]]  . fluticasone (FLONASE) 50 MCG/ACT nasal spray    Sig: Place 2 sprays into both nostrils daily.    Dispense:  16 g    Refill:  0    Order Specific Question:   Supervising Provider    Answer:   Eustace Moore [[id number]]  . benzonatate (TESSALON) 100 MG capsule    Sig: Take 1 capsule (100 mg  total) by mouth every 8 (eight) hours.    Dispense:  21 capsule    Refill:  0    Order Specific Question:   Supervising Provider    Answer:   Eustace Moore [[id number]]    Get plenty of rest and push fluids Tessalon Perles prescribed for cough Zyrtec-D prescribed for nasal congestion, runny nose, and/or sore throat Flonase prescribed for nasal congestion and runny nose Use medications daily for symptom relief Use OTC medications like ibuprofen or tylenol as needed fever or pain Follow up with PCP or with community health if symptoms persist Return or go to ER if you have any new or worsening symptoms fever, chills, nausea, vomiting, chest pain, cough, shortness of breath, wheezing, abdominal pain, changes in bowel or bladder habits, etc...  Reviewed expectations re: course of current medical issues. Questions answered. Outlined signs and symptoms indicating need for more acute intervention. Patient verbalized understanding. After Visit Summary given.         Rennis Harding, PA-C 03/18/18 1624

## 2018-03-18 NOTE — Discharge Instructions (Signed)
Get plenty of rest and push fluids Tessalon Perles prescribed for cough Zyrtec-D prescribed for nasal congestion, runny nose, and/or sore throat Flonase prescribed for nasal congestion and runny nose Use medications daily for symptom relief Use OTC medications like ibuprofen or tylenol as needed fever or pain Follow up with PCP or with community health if symptoms persist Return or go to ER if you have any new or worsening symptoms fever, chills, nausea, vomiting, chest pain, cough, shortness of breath, wheezing, abdominal pain, changes in bowel or bladder habits, etc..Marland Kitchen

## 2018-03-18 NOTE — ED Triage Notes (Addendum)
Pt reports nasal congestion and a strong cough x3 days.  He has taken several OTC medications with no relief.

## 2018-09-06 ENCOUNTER — Other Ambulatory Visit: Payer: Self-pay

## 2018-09-06 ENCOUNTER — Encounter (HOSPITAL_COMMUNITY): Payer: Self-pay

## 2018-09-06 ENCOUNTER — Ambulatory Visit (HOSPITAL_COMMUNITY)
Admission: EM | Admit: 2018-09-06 | Discharge: 2018-09-06 | Disposition: A | Discharge status: Home / Self Care | Payer: HRSA Program | Attending: Physician Assistant | Admitting: Physician Assistant

## 2018-09-06 DIAGNOSIS — J45909 Unspecified asthma, uncomplicated: Secondary | ICD-10-CM | POA: Insufficient documentation

## 2018-09-06 DIAGNOSIS — J069 Acute upper respiratory infection, unspecified: Secondary | ICD-10-CM | POA: Diagnosis not present

## 2018-09-06 DIAGNOSIS — Z79899 Other long term (current) drug therapy: Secondary | ICD-10-CM | POA: Insufficient documentation

## 2018-09-06 DIAGNOSIS — Z20828 Contact with and (suspected) exposure to other viral communicable diseases: Secondary | ICD-10-CM | POA: Diagnosis not present

## 2018-09-06 DIAGNOSIS — F17290 Nicotine dependence, other tobacco product, uncomplicated: Secondary | ICD-10-CM | POA: Insufficient documentation

## 2018-09-06 NOTE — ED Triage Notes (Signed)
Patient presents to Urgent Care with complaints of nasal congestion and dry cough since 2-3 days ago. 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 Pt states his kids have had runny noses recently too.

## 2018-09-06 NOTE — Discharge Instructions (Signed)
Return if any problems.

## 2018-09-06 NOTE — ED Provider Notes (Signed)
[facility]    CSN: [id number] Arrival date & time: 09/06/18  1112      History   Chief Complaint Chief Complaint  Patient presents with  . Nasal Congestion    HPI Louis Smith is a 23 y.o. male.   The history is provided by the patient. No language interpreter was used.  Cough Cough characteristics:  Productive Sputum characteristics:  Nondescript Severity:  Moderate Onset quality:  Gradual Duration:  3 days Timing:  Constant Progression:  Worsening Chronicity:  New Smoker: no   Relieved by:  Nothing Worsened by:  Nothing Ineffective treatments:  None tried Associated symptoms: rhinorrhea     Past Medical History:  Diagnosis Date  . Asthma     There are no active problems to display for this patient.   Past Surgical History:  Procedure Laterality Date  . FACIAL COSMETIC SURGERY         Home Medications    Prior to Admission medications   Medication Sig Start Date End Date Taking? Authorizing Provider  acetaminophen (TYLENOL) 325 MG tablet Take 650 mg by mouth every 6 (six) hours as needed for mild pain.    [provider]  benzonatate (TESSALON) 100 MG capsule Take 1 capsule (100 mg total) by mouth every 8 (eight) hours. 03/18/18   Wurst, [place], PA-C  cetirizine-pseudoephedrine (ZYRTEC-D) 5-120 MG tablet Take 1 tablet by mouth daily. 03/18/18   Wurst, [place], PA-C  fluticasone (FLONASE) 50 MCG/ACT nasal spray Place 2 sprays into both nostrils daily. 03/18/18   Rennis HardingWurst, Brittany, PA-C    Family History Family History  Problem Relation Age of Onset  . Healthy Mother   . Healthy Father     Social History Social History   Tobacco Use  . Smoking status: Current Every Day Smoker    Types: Cigars  . Smokeless tobacco: Never Used  . Tobacco comment: 1 black and mild per day  Substance Use Topics  . Alcohol use: Yes    Comment: socially  . Drug use: Yes    Types: Marijuana     Allergies   Patient has no known  allergies.   Review of Systems Review of Systems  HENT: Positive for rhinorrhea.   Respiratory: Positive for cough.   All other systems reviewed and are negative.    Physical Exam Triage Vital Signs ED Triage Vitals  Enc Vitals Group     BP 09/06/18 1216 138/86     Pulse Rate 09/06/18 1216 74     Resp 09/06/18 1216 17     Temp 09/06/18 1216 98.4 F (36.9 C)     Temp Source 09/06/18 1216 Oral     SpO2 09/06/18 1216 99 %     Weight --      Height --      Head Circumference --      Peak Flow --      Pain Score 09/06/18 1214 7     Pain Loc --      Pain Edu? --      Excl. in [place]? --    No data found.  Updated Vital Signs BP 138/86 (BP Location: Right Arm)   Pulse 74   Temp 98.4 F (36.9 C) (Oral)   Resp 17   SpO2 99%   Visual Acuity Right Eye Distance:   Left Eye Distance:   Bilateral Distance:    Right Eye Near:   Left Eye Near:    Bilateral Near:  Physical Exam Vitals signs and nursing note reviewed.  Constitutional:      Appearance: He is well-developed.  HENT:     Head: Normocephalic and atraumatic.     Nose: Nose normal.     Mouth/Throat:     Mouth: Mucous membranes are moist.  Eyes:     Conjunctiva/sclera: Conjunctivae normal.  Neck:     Musculoskeletal: Neck supple.  Cardiovascular:     Rate and Rhythm: Normal rate and regular rhythm.     Heart sounds: No murmur.  Pulmonary:     Effort: Pulmonary effort is normal. No respiratory distress.     Breath sounds: Normal breath sounds.  Abdominal:     Palpations: Abdomen is soft.     Tenderness: There is no abdominal tenderness.  Skin:    General: Skin is warm and dry.  Neurological:     General: No focal deficit present.     Mental Status: He is alert.  Psychiatric:        Mood and Affect: Mood normal.      UC Treatments / Results  Labs (all labs ordered are listed, but only abnormal results are displayed) Labs Reviewed  NOVEL CORONAVIRUS, NAA (HOSP ORDER, SEND-OUT TO REF LAB; TAT  18-24 HRS)    EKG   Radiology No results found.  Procedures Procedures (including critical care time)  Medications Ordered in UC Medications - No data to display  Initial Impression / Assessment and Plan / UC Course  I have reviewed the triage vital signs and the nursing notes.  Pertinent labs & imaging results that were available during my care of the patient were reviewed by me and considered in my medical decision making (see chart for details).    MDM  Pt has symptoms consistent with COvid.  Pt works in [employer] care.  Pt is tested every week at work.  He reports he was negative last week.  Final Clinical Impressions(s) / UC Diagnoses   Final diagnoses:  URI, acute     Discharge Instructions     Return if any problems.    ED Prescriptions    None     Controlled Substance Prescriptions [place] Controlled Substance Registry consulted? Not Applicable  An After Visit Summary was printed and given to the patient.    Fransico Meadow, [place] 09/06/18 1323

## 2018-09-07 LAB — NOVEL CORONAVIRUS, NAA (HOSP ORDER, SEND-OUT TO REF LAB; TAT 18-24 HRS): SARS-CoV-2, NAA: NOT DETECTED

## 2018-09-08 ENCOUNTER — Encounter (HOSPITAL_COMMUNITY): Payer: Self-pay

## 2018-09-14 IMAGING — CR DG CHEST 2V
2 series · 2 of 2 positions shown · non-contrast
Comparison: PA and lateral chest 05/14/2017. Single-view of the
chest 07/26/2013.

CLINICAL DATA: Shortness of breath for 1 week. Chest pain last
week.

EXAM:
CHEST - 2 VIEW

[w chest pa]
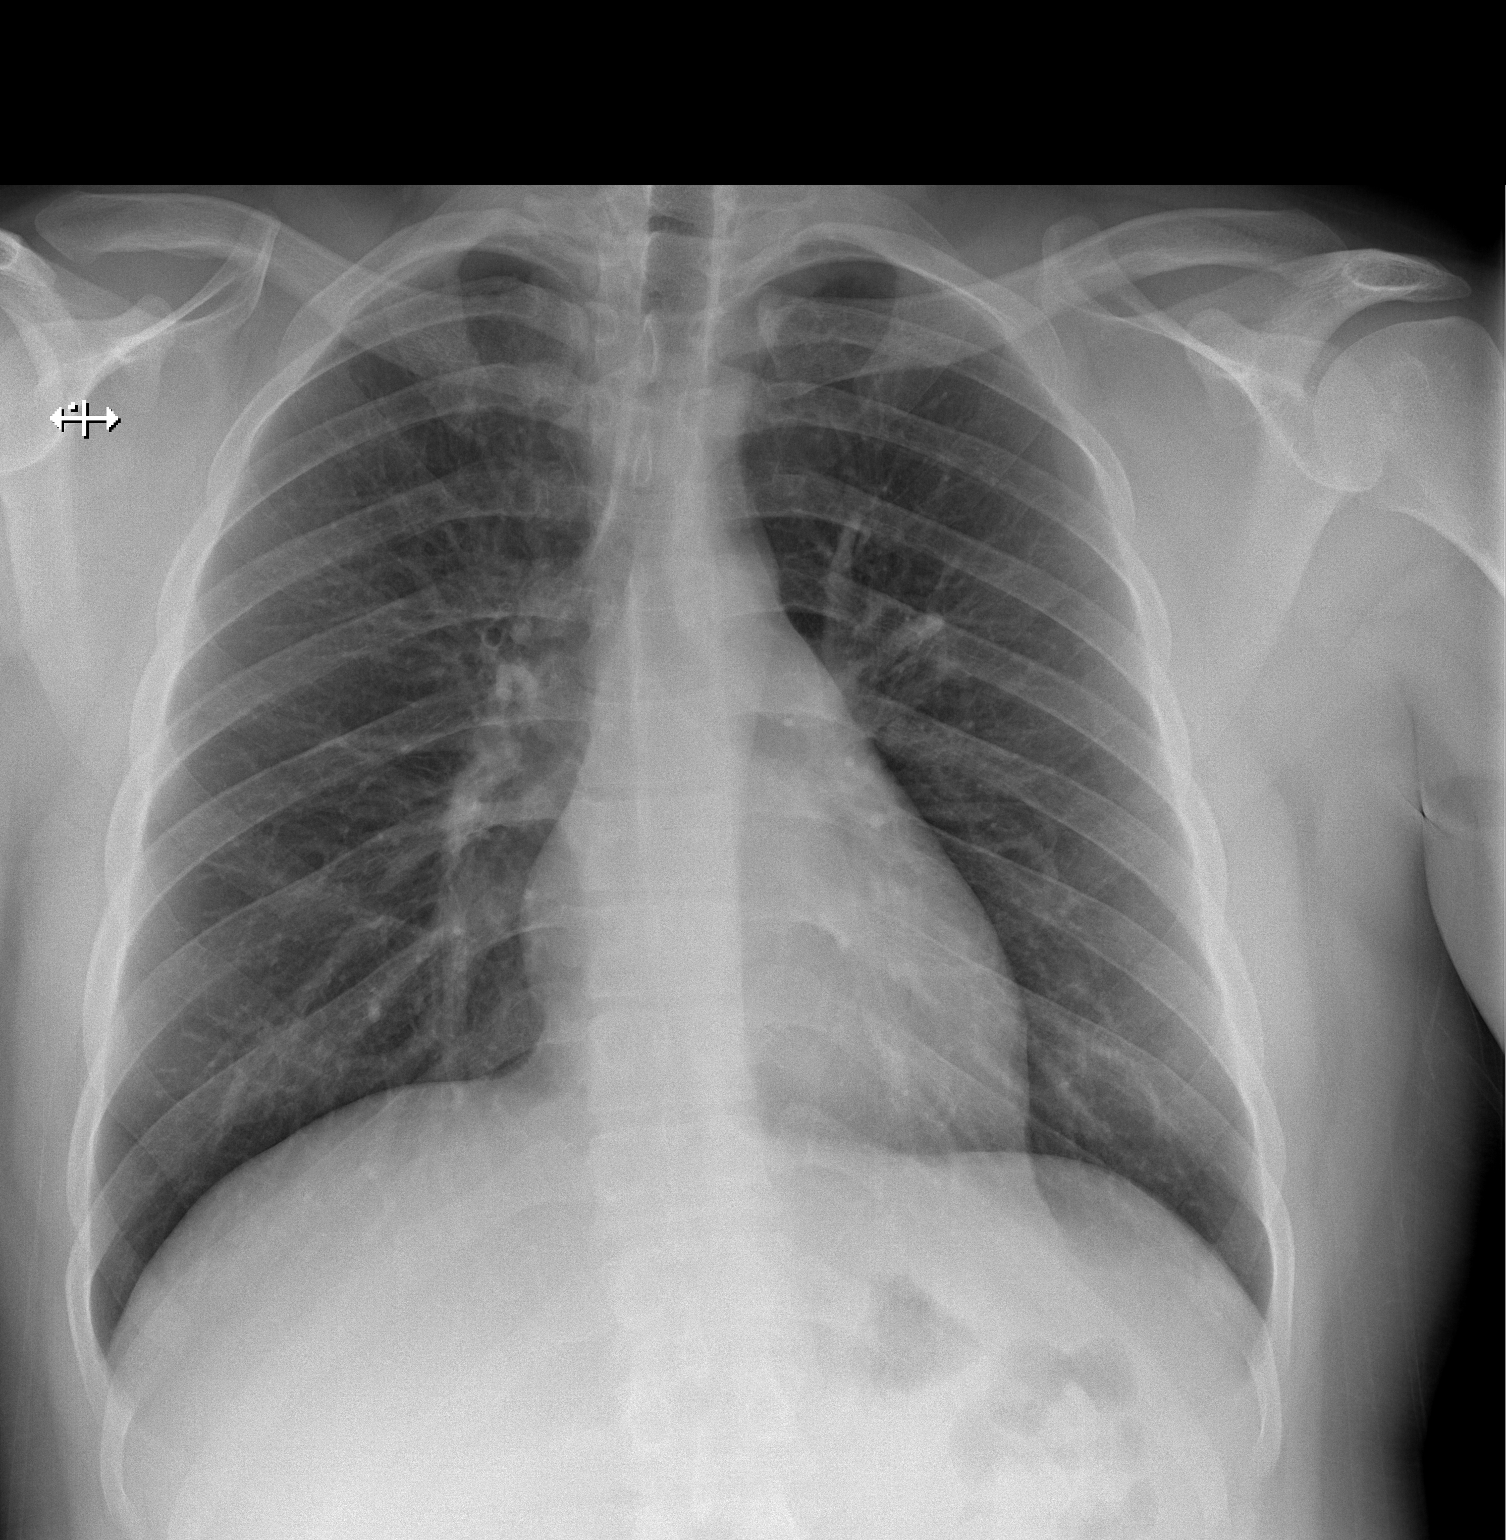

[w chest lat]
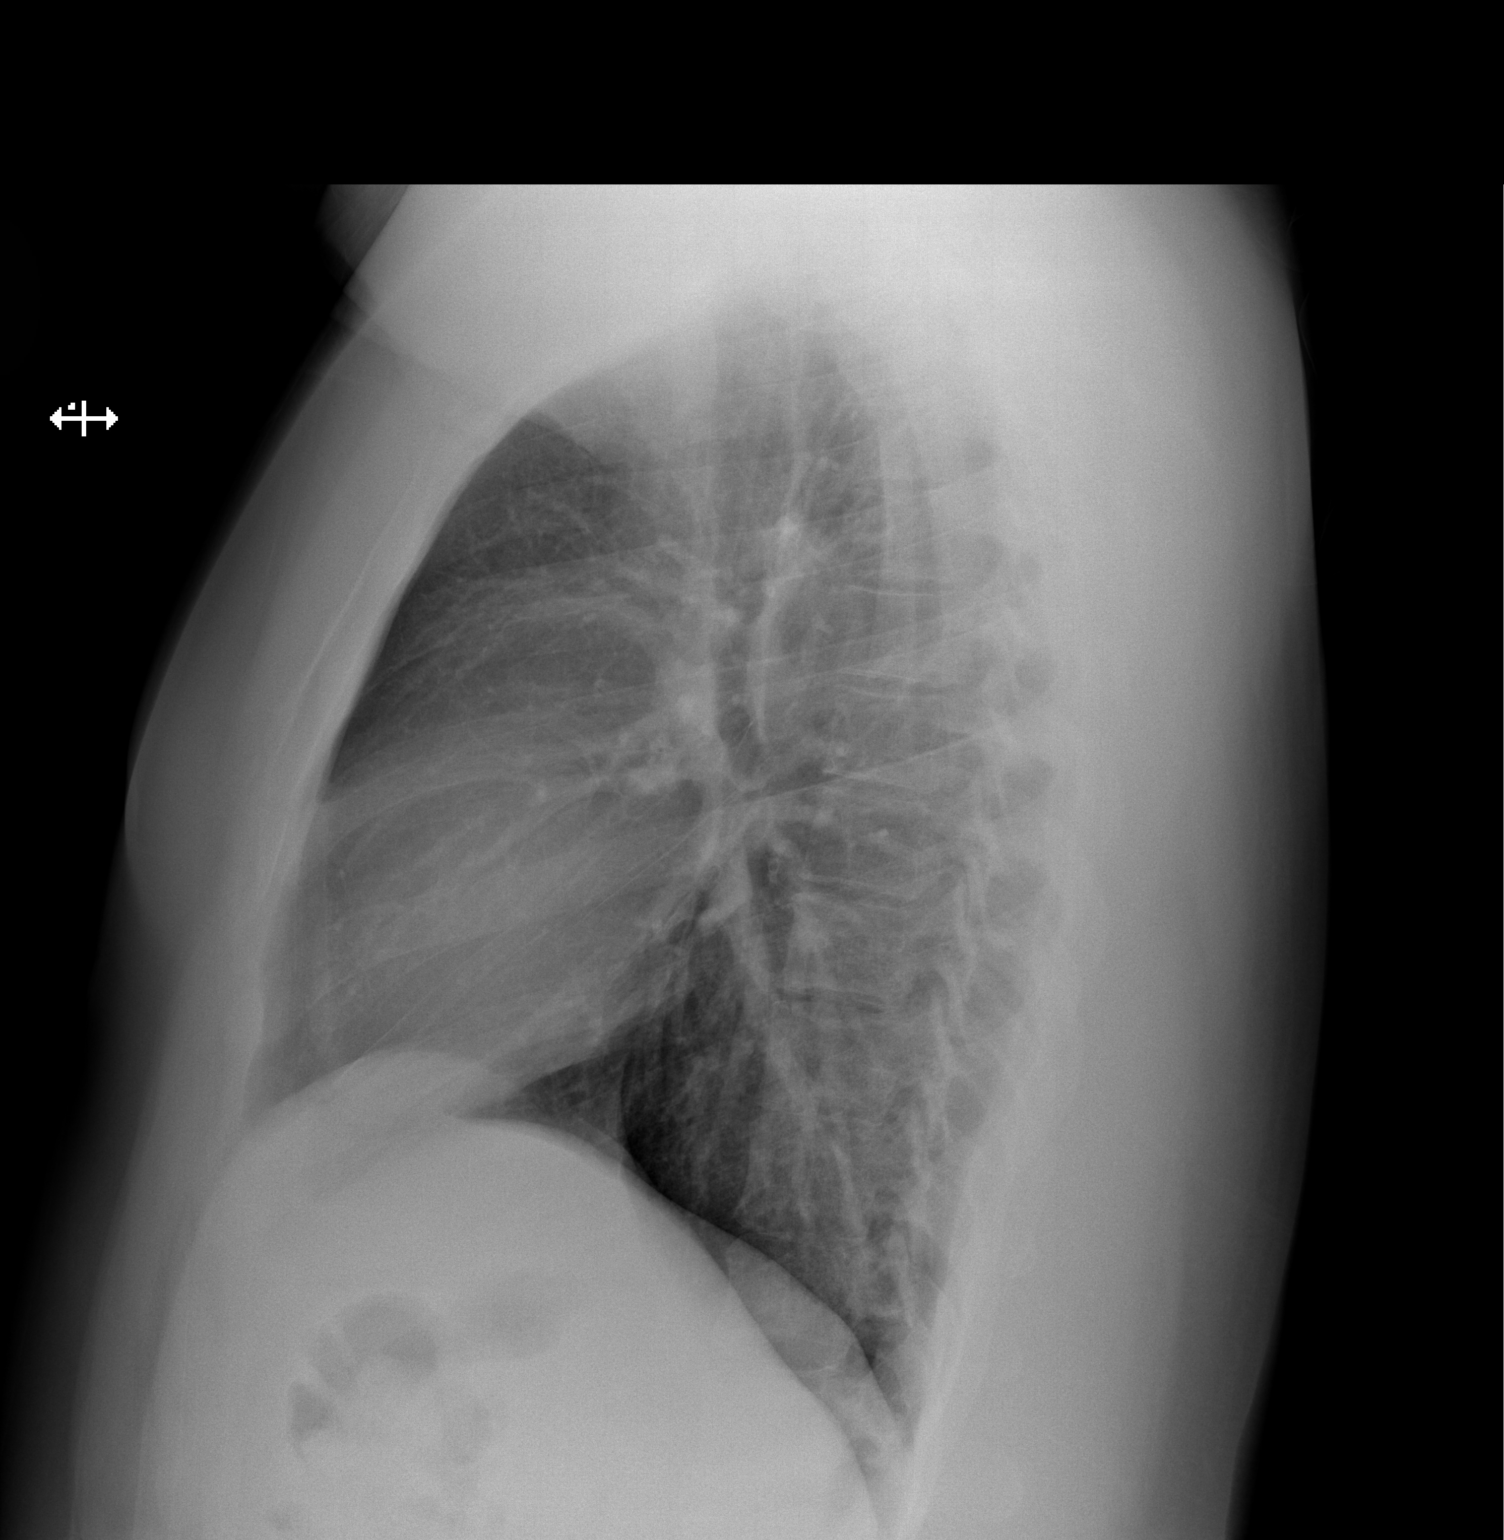

[2 of 2 positions shown; findings below may reference images not displayed]

FINDINGS: The lungs are clear. Heart size is normal. No pneumothorax or
pleural fluid. No bony abnormality.
IMPRESSION: Negative chest.

## 2018-09-29 ENCOUNTER — Other Ambulatory Visit: Payer: Self-pay

## 2018-09-29 ENCOUNTER — Encounter (HOSPITAL_COMMUNITY): Payer: Self-pay

## 2018-09-29 ENCOUNTER — Ambulatory Visit (HOSPITAL_COMMUNITY)
Admission: EM | Admit: 2018-09-29 | Discharge: 2018-09-29 | Disposition: A | Discharge status: Home / Self Care | Payer: Self-pay | Attending: Emergency Medicine | Admitting: Emergency Medicine

## 2018-09-29 DIAGNOSIS — R509 Fever, unspecified: Secondary | ICD-10-CM

## 2018-09-29 MED ORDER — FLUTICASONE PROPIONATE 50 MCG/ACT NA SUSP: 1.0000 | Freq: Every day | NASAL | 0 refills | Status: AC

## 2018-09-29 NOTE — Discharge Instructions (Signed)
May try flonase nasal spray to help with congestion

## 2018-09-29 NOTE — ED Provider Notes (Signed)
[facility]    CSN: [id number] Arrival date & time: 09/29/18  1430      History   Chief Complaint Chief Complaint  Patient presents with  . work note    HPI Louis Smith is a 23 y.o. male history of asthma presenting today for evaluation of fever.  Yesterday when he went into work he was noted to have a fever of 100.2.  He states that he had just gotten out of his hot car and was wearing multiple layers along with a hat.  States that within 10 minutes repeat temperatures were 99.1.  He has not had any symptoms.  Denies cough, congestion, sore throat.  Denies shortness of breath or chest pain.  Denies nausea or vomiting.  Had a URI 2 weeks ago, but said the symptoms have resolved.  Denies known exposure to COVID.  Requesting work note.  HPI  Past Medical History:  Diagnosis Date  . Asthma     There are no active problems to display for this patient.   Past Surgical History:  Procedure Laterality Date  . FACIAL COSMETIC SURGERY         Home Medications    Prior to Admission medications   Medication Sig Start Date End Date Taking? Authorizing Provider  acetaminophen (TYLENOL) 325 MG tablet Take 650 mg by mouth every 6 (six) hours as needed for mild pain.    [provider]  benzonatate (TESSALON) 100 MG capsule Take 1 capsule (100 mg total) by mouth every 8 (eight) hours. 03/18/18   Wurst, [place], PA-C  cetirizine-pseudoephedrine (ZYRTEC-D) 5-120 MG tablet Take 1 tablet by mouth daily. 03/18/18   Wurst, [place], PA-C  fluticasone (FLONASE) 50 MCG/ACT nasal spray Place 1-2 sprays into both nostrils daily for 7 days. 09/29/18 10/06/18  Wieters, Junius Creamer, PA-C    Family History Family History  Problem Relation Age of Onset  . Healthy Mother   . Healthy Father     Social History Social History   Tobacco Use  . Smoking status: Current Every Day Smoker    Types: Cigars  . Smokeless tobacco: Never Used  . Tobacco comment: 1 black and mild per  day  Substance Use Topics  . Alcohol use: Yes    Comment: socially  . Drug use: Yes    Types: Marijuana     Allergies   Patient has no known allergies.   Review of Systems Review of Systems  Constitutional: Positive for fever. Negative for activity change, appetite change, chills and fatigue.  HENT: Negative for congestion, ear pain, rhinorrhea, sinus pressure, sore throat and trouble swallowing.   Eyes: Negative for discharge and redness.  Respiratory: Negative for cough, chest tightness and shortness of breath.   Cardiovascular: Negative for chest pain.  Gastrointestinal: Negative for abdominal pain, diarrhea, nausea and vomiting.  Musculoskeletal: Negative for myalgias.  Skin: Negative for rash.  Neurological: Negative for dizziness, light-headedness and headaches.     Physical Exam Triage Vital Signs ED Triage Vitals  Enc Vitals Group     BP 09/29/18 1452 (!) 149/111     Pulse Rate 09/29/18 1452 89     Resp 09/29/18 1452 16     Temp 09/29/18 1452 98.3 F (36.8 C)     Temp Source 09/29/18 1452 Tympanic     SpO2 09/29/18 1452 98 %     Weight 09/29/18 1454 250 lb (113.4 kg)     Height --      Head Circumference --  Peak Flow --      Pain Score 09/29/18 1533 0     Pain Loc --      Pain Edu? --      Excl. in [place]? --    No data found.  Updated Vital Signs BP (!) 149/111 (BP Location: Left Arm)   Pulse 89   Temp 98.3 F (36.8 C) (Tympanic)   Resp 16   Wt 250 lb (113.4 kg)   SpO2 98%   BMI 30.43 kg/m   Visual Acuity Right Eye Distance:   Left Eye Distance:   Bilateral Distance:    Right Eye Near:   Left Eye Near:    Bilateral Near:     Physical Exam Vitals signs and nursing note reviewed.  Constitutional:      Appearance: He is well-developed.  HENT:     Head: Normocephalic and atraumatic.     Ears:     Comments: Bilateral ears without tenderness to palpation of external auricle, tragus and mastoid, EAC's without erythema or swelling, TM's  with good bony landmarks and cone of light. Non erythematous.  Inferior aspect of TM appears slightly opaque as of congestion present posteriorly.     Mouth/Throat:     Comments: Oral mucosa pink and moist, no tonsillar enlargement or exudate. Posterior pharynx patent and nonerythematous, no uvula deviation or swelling. Normal phonation. Eyes:     Conjunctiva/sclera: Conjunctivae normal.  Neck:     Musculoskeletal: Neck supple.  Cardiovascular:     Rate and Rhythm: Normal rate and regular rhythm.     Heart sounds: No murmur.  Pulmonary:     Effort: Pulmonary effort is normal. No respiratory distress.     Breath sounds: Normal breath sounds.     Comments: Breathing comfortably at rest, CTABL, no wheezing, rales or other adventitious sounds auscultated Abdominal:     Palpations: Abdomen is soft.     Tenderness: There is no abdominal tenderness.  Skin:    General: Skin is warm and dry.  Neurological:     Mental Status: He is alert.      UC Treatments / Results  Labs (all labs ordered are listed, but only abnormal results are displayed) Labs Reviewed - No data to display  EKG   Radiology No results found.  Procedures Procedures (including critical care time)  Medications Ordered in UC Medications - No data to display  Initial Impression / Assessment and Plan / UC Course  I have reviewed the triage vital signs and the nursing notes.  Pertinent labs & imaging results that were available during my care of the patient were reviewed by me and considered in my medical decision making (see chart for details).     Vital signs stable, elevated fever likely pulling multiple layers and sitting in a car.  Feel this is likely not a true representation of fever.  Patient otherwise asymptomatic.  He was tested at work for [employer].  Will recommend patient can return.  Continue to monitor for development of symptoms.  Discussed strict return precautions. Patient verbalized understanding  and is agreeable with plan.  Final Clinical Impressions(s) / UC Diagnoses   Final diagnoses:  Fever, unspecified     Discharge Instructions     May try flonase nasal spray to help with congestion   ED Prescriptions    Medication Sig Dispense Auth. Provider   fluticasone (FLONASE) 50 MCG/ACT nasal spray Place 1-2 sprays into both nostrils daily for 7 days. 1 g Wieters, [place] C,  PA-C     PDMP not reviewed this encounter.   Lew DawesWieters, Hallie C, [place] JerseyPA-C 09/29/18 1540

## 2018-09-29 NOTE — ED Triage Notes (Addendum)
Pt states he was sent home. Because he had temp of 102  yesterday. Pt states he needs a to return back to work.

## 2018-12-19 ENCOUNTER — Other Ambulatory Visit: Payer: Self-pay

## 2018-12-19 ENCOUNTER — Ambulatory Visit (HOSPITAL_COMMUNITY)
Admission: EM | Admit: 2018-12-19 | Discharge: 2018-12-19 | Disposition: A | Discharge status: Home / Self Care | Payer: HRSA Program | Attending: Family Medicine | Admitting: Family Medicine

## 2018-12-19 ENCOUNTER — Encounter (HOSPITAL_COMMUNITY): Payer: Self-pay

## 2018-12-19 DIAGNOSIS — U071 COVID-19: Secondary | ICD-10-CM

## 2018-12-19 LAB — POC SARS CORONAVIRUS 2 AG: SARS Coronavirus 2 Ag: POSITIVE — AB

## 2018-12-19 LAB — POC SARS CORONAVIRUS 2 AG -  ED: SARS Coronavirus 2 Ag: POSITIVE — AB

## 2018-12-19 NOTE — ED Triage Notes (Signed)
Pt present cough, nasal congestion and sore throat. Symptoms started on Friday. Pt has tried otc medication with no relief.

## 2018-12-19 NOTE — ED Provider Notes (Signed)
[facility]    CSN: [id number] Arrival date & time: 12/19/18  1420      History   Chief Complaint Chief Complaint  Patient presents with  . Cough  . Nasal Congestion  . Sore Throat    HPI Louis Smith is a 23 y.o. male.   HPI  Patient noted nasal congestion, coughing, sore throat Friday of last week.  He said symptoms for 3 days.  He states his mother had been Covid 2 weeks ago.  He had some exposure to her.  He works in a nursing home.  He states that many people in the nursing home positive for Covid.  He is uncertain where his exposure may be, but suspects the nursing home.  Today he comes in for testing.  His Covid test is positive.  We spent most of his visit discussing symptoms, quarantine, home treatment, reasons for return  Past Medical History:  Diagnosis Date  . Asthma     There are no problems to display for this patient.   Past Surgical History:  Procedure Laterality Date  . FACIAL COSMETIC SURGERY         Home Medications    Prior to Admission medications   Medication Sig Start Date End Date Taking? Authorizing Provider  acetaminophen (TYLENOL) 325 MG tablet Take 650 mg by mouth every 6 (six) hours as needed for mild pain.    [provider]  fluticasone (FLONASE) 50 MCG/ACT nasal spray Place 1-2 sprays into both nostrils daily for 7 days. 09/29/18 10/06/18  Wieters, Junius CreamerHallie C, PA-C    Family History Family History  Problem Relation Age of Onset  . Healthy Mother   . Healthy Father     Social History Social History   Tobacco Use  . Smoking status: Current Every Day Smoker    Types: Cigars  . Smokeless tobacco: Never Used  . Tobacco comment: 1 black and mild per day  Substance Use Topics  . Alcohol use: Yes    Comment: socially  . Drug use: Yes    Types: Marijuana     Allergies   Patient has no known allergies.   Review of Systems Review of Systems  Constitutional: Negative for chills and fever.  HENT:  Positive for congestion, rhinorrhea and sore throat. Negative for hearing loss.   Eyes: Negative for pain.  Respiratory: Negative for cough and shortness of breath.   Cardiovascular: Negative for chest pain and leg swelling.  Gastrointestinal: Negative for abdominal pain, constipation and diarrhea.  Genitourinary: Negative for dysuria and frequency.  Musculoskeletal: Negative for myalgias.  Neurological: Negative for dizziness, seizures and headaches.  Psychiatric/Behavioral: The patient is not nervous/anxious.      Physical Exam Triage Vital Signs ED Triage Vitals  Enc Vitals Group     BP 12/19/18 1437 140/89     Pulse Rate 12/19/18 1437 79     Resp 12/19/18 1437 18     Temp 12/19/18 1437 99 F (37.2 C)     Temp Source 12/19/18 1437 Oral     SpO2 12/19/18 1437 99 %     Weight --      Height --      Head Circumference --      Peak Flow --      Pain Score 12/19/18 1522 0     Pain Loc --      Pain Edu? --      Excl. in [place]? --    No data found.  Updated Vital Signs BP 140/89 (BP Location: Left Arm)   Pulse 79   Temp 99 F (37.2 C) (Oral)   Resp 18   SpO2 99%   Physical Exam Constitutional:      General: He is not in acute distress.    Appearance: He is well-developed and normal weight.  HENT:     Head: Normocephalic and atraumatic.     Mouth/Throat:     Comments: mask Eyes:     Conjunctiva/sclera: Conjunctivae normal.     Pupils: Pupils are equal, round, and reactive to light.  Cardiovascular:     Rate and Rhythm: Normal rate and regular rhythm.  Pulmonary:     Effort: Pulmonary effort is normal. No respiratory distress.     Breath sounds: Normal breath sounds.  Abdominal:     General: There is no distension.     Palpations: Abdomen is soft.  Musculoskeletal:        General: Normal range of motion.     Cervical back: Normal range of motion.  Skin:    General: Skin is warm and dry.  Neurological:     Mental Status: He is alert.  Psychiatric:         Mood and Affect: Mood normal.        Behavior: Behavior normal.   When I first enter the room the patient is talking on the telephone with his mask down.  I asked him to keep his mask in place during the duration of his visit.  I explained to him that he had Covid and that I would bring back papers to excuse him from work and discuss management.  When I came back in his room the second time he had his mask off again.  I emphasized to him again the importance of keeping his mask on to prevent spread of coronavirus.   UC Treatments / Results  Labs (all labs ordered are listed, but only abnormal results are displayed) Labs Reviewed  POC SARS CORONAVIRUS 2 AG -  ED - Abnormal; Notable for the following components:      Result Value   SARS Coronavirus 2 Ag POSITIVE (*)    All other components within normal limits  POC SARS CORONAVIRUS 2 AG - Abnormal; Notable for the following components:   SARS Coronavirus 2 Ag POSITIVE (*)    All other components within normal limits    EKG   Radiology No results found.  Procedures Procedures (including critical care time)  Medications Ordered in UC Medications - No data to display  Initial Impression / Assessment and Plan / UC Course  I have reviewed the triage vital signs and the nursing notes.  Pertinent labs & imaging results that were available during my care of the patient were reviewed by me and considered in my medical decision making (see chart for details).      Final Clinical Impressions(s) / UC Diagnoses   Final diagnoses:  COVID-19 virus infection     Discharge Instructions     You have COVID-19 You must quarantine at home until 10 days have passed since your symptoms began.  If you are improving, and if you have no fever, then you may return to work. Please contact your work ASAP to let them know you are positive for Covid.  Call any family members that you have been exposed to as well You may take Tylenol for pain and  fever You may take over-the-counter cough and cold medicines Please call if  you have any change in your symptoms.  Make every effort to stay at home. Go to the emergency room if you have severe symptoms, difficulty breathing, or dizziness.   ED Prescriptions    None     PDMP not reviewed this encounter.   Raylene Everts, MD 12/19/18 1534

## 2018-12-19 NOTE — Discharge Instructions (Signed)
You have COVID-19 You must quarantine at home until 10 days have passed since your symptoms began.  If you are improving, and if you have no fever, then you may return to work. Please contact your work ASAP to let them know you are positive for Covid.  Call any family members that you have been exposed to as well You may take Tylenol for pain and fever You may take over-the-counter cough and cold medicines Please call if you have any change in your symptoms.  Make every effort to stay at home. Go to the emergency room if you have severe symptoms, difficulty breathing, or dizziness.

## 2019-01-18 ENCOUNTER — Ambulatory Visit (HOSPITAL_COMMUNITY)
Admission: EM | Admit: 2019-01-18 | Discharge: 2019-01-18 | Disposition: A | Discharge status: Home / Self Care | Payer: HRSA Program

## 2019-01-18 ENCOUNTER — Encounter (HOSPITAL_COMMUNITY): Payer: Self-pay | Admitting: Family Medicine

## 2019-01-18 ENCOUNTER — Other Ambulatory Visit: Payer: Self-pay

## 2019-01-18 DIAGNOSIS — U071 COVID-19: Secondary | ICD-10-CM

## 2019-01-18 DIAGNOSIS — Z7689 Persons encountering health services in other specified circumstances: Secondary | ICD-10-CM

## 2019-01-18 NOTE — Discharge Instructions (Signed)
Louis Smith has satisfied CDC recommendations to end isolation. Current CDC recommendations for ending isolation are:   1. At least 10 days passed since symptoms first appeared, 2. At least 24 hours has passed since last fever without use of fever-reducing medications and 3. Symptoms have improved.  Furthermore CDC recommends against covid-19 testing to establish recovery from covid-19 infection. Testing is not recommended for 90 days.

## 2019-01-18 NOTE — ED Triage Notes (Signed)
Pt states he has tested positive  In December. Pt states he wants to go back to work. Pt also states that he still has symptoms of Covid.  Pt  States he can't  taste or smell . Pt is also congested.

## 2019-01-19 NOTE — ED Provider Notes (Signed)
[facility]    CSN: [id number] Arrival date & time: 01/18/19  1529      History   Chief Complaint Chief Complaint  Patient presents with  . Appointment    330  . covid test    HPI Louis Smith is a 24 y.o. male.   Patient is a 24 year old male who presents today for return to work evaluation.  Patient tested positive for Covid 3 weeks ago.  His symptoms have improved.  He still having mild loss of taste or smell but denies any fever.     Past Medical History:  Diagnosis Date  . Asthma     There are no problems to display for this patient.   Past Surgical History:  Procedure Laterality Date  . FACIAL COSMETIC SURGERY         Home Medications    Prior to Admission medications   Medication Sig Start Date End Date Taking? Authorizing Provider  acetaminophen (TYLENOL) 325 MG tablet Take 650 mg by mouth every 6 (six) hours as needed for mild pain.    [provider]  fluticasone (FLONASE) 50 MCG/ACT nasal spray Place 1-2 sprays into both nostrils daily for 7 days. 09/29/18 10/06/18  Wieters, Elesa Hacker, PA-C    Family History Family History  Problem Relation Age of Onset  . Healthy Mother   . Healthy Father     Social History Social History   Tobacco Use  . Smoking status: Current Every Day Smoker    Types: Cigars  . Smokeless tobacco: Never Used  . Tobacco comment: 1 black and mild per day  Substance Use Topics  . Alcohol use: Yes    Comment: socially  . Drug use: Yes    Types: Marijuana     Allergies   Patient has no known allergies.   Review of Systems Review of Systems  Constitutional: Negative for chills, fatigue and fever.  HENT: Negative for postnasal drip, rhinorrhea and sore throat.   Respiratory: Negative for cough and shortness of breath.   Gastrointestinal: Negative for diarrhea.     Physical Exam Triage Vital Signs ED Triage Vitals  Enc Vitals Group     BP 01/18/19 1546 134/81     Pulse Rate 01/18/19  1546 88     Resp 01/18/19 1546 18     Temp 01/18/19 1546 98.2 F (36.8 C)     Temp Source 01/18/19 1546 Oral     SpO2 01/18/19 1546 97 %     Weight 01/18/19 1545 232 lb 6.4 oz (105.4 kg)     Height --      Head Circumference --      Peak Flow --      Pain Score 01/18/19 1602 0     Pain Loc --      Pain Edu? --      Excl. in [place]? --    No data found.  Updated Vital Signs BP 134/81 (BP Location: Right Arm)   Pulse 88   Temp 98.2 F (36.8 C) (Oral)   Resp 18   Wt 232 lb 6.4 oz (105.4 kg)   SpO2 97%   BMI 28.29 kg/m   Visual Acuity Right Eye Distance:   Left Eye Distance:   Bilateral Distance:    Right Eye Near:   Left Eye Near:    Bilateral Near:     Physical Exam Vitals and nursing note reviewed.  Constitutional:      Appearance: Normal appearance.  HENT:     Head: Normocephalic and atraumatic.     Nose: Nose normal.  Eyes:     Conjunctiva/sclera: Conjunctivae normal.  Pulmonary:     Effort: Pulmonary effort is normal.  Musculoskeletal:        General: Normal range of motion.     Cervical back: Normal range of motion.  Skin:    General: Skin is warm and dry.  Neurological:     Mental Status: He is alert.  Psychiatric:        Mood and Affect: Mood normal.      UC Treatments / Results  Labs (all labs ordered are listed, but only abnormal results are displayed) Labs Reviewed - No data to display  EKG   Radiology No results found.  Procedures Procedures (including critical care time)  Medications Ordered in UC Medications - No data to display  Initial Impression / Assessment and Plan / UC Course  I have reviewed the triage vital signs and the nursing notes.  Pertinent labs & imaging results that were available during my care of the patient were reviewed by me and considered in my medical decision making (see chart for details).     Return to work after positive Covid-patient has met all criteria to return to work. Return to work not given   Final Clinical Impressions(s) / UC Diagnoses   Final diagnoses:  COVID-19  Return to work evaluation     Discharge Instructions     Louis Smith has satisfied CDC recommendations to end isolation. Current CDC recommendations for ending isolation are:   1. At least 10 days passed since symptoms first appeared, 2. At least 24 hours has passed since last fever without use of fever-reducing medications and 3. Symptoms have improved.  Furthermore CDC recommends against covid-19 testing to establish recovery from covid-19 infection. Testing is not recommended for 90 days.     ED Prescriptions    None     PDMP not reviewed this encounter.   Orvan July, NP 01/19/19 [phone]

## 2021-06-14 ENCOUNTER — Emergency Department (HOSPITAL_COMMUNITY): Payer: Self-pay

## 2021-06-14 ENCOUNTER — Encounter (HOSPITAL_COMMUNITY): Payer: Self-pay | Admitting: Emergency Medicine

## 2021-06-14 ENCOUNTER — Ambulatory Visit (HOSPITAL_COMMUNITY)
Admission: EM | Admit: 2021-06-14 | Discharge: 2021-06-14 | Disposition: A | Discharge status: Home / Self Care | Payer: Self-pay

## 2021-06-14 ENCOUNTER — Emergency Department (HOSPITAL_COMMUNITY)
Admission: EM | Admit: 2021-06-14 | Discharge: 2021-06-14 | Disposition: A | Discharge status: Home / Self Care | Payer: Self-pay | Attending: Emergency Medicine | Admitting: Emergency Medicine

## 2021-06-14 DIAGNOSIS — Z20822 Contact with and (suspected) exposure to covid-19: Secondary | ICD-10-CM | POA: Insufficient documentation

## 2021-06-14 DIAGNOSIS — J36 Peritonsillar abscess: Secondary | ICD-10-CM | POA: Insufficient documentation

## 2021-06-14 DIAGNOSIS — J020 Streptococcal pharyngitis: Secondary | ICD-10-CM

## 2021-06-14 DIAGNOSIS — J45909 Unspecified asthma, uncomplicated: Secondary | ICD-10-CM | POA: Insufficient documentation

## 2021-06-14 LAB — COMPREHENSIVE METABOLIC PANEL
ALT: 32 U/L (ref 0–44)
AST: 16 U/L (ref 15–41)
Albumin: 4 g/dL (ref 3.5–5.0)
Alkaline Phosphatase: 93 U/L (ref 38–126)
Anion gap: 10 (ref 5–15)
BUN: 12 mg/dL (ref 6–20)
CO2: 27 mmol/L (ref 22–32)
Calcium: 9.4 mg/dL (ref 8.9–10.3)
Chloride: 104 mmol/L (ref 98–111)
Creatinine, Ser: 1.18 mg/dL (ref 0.61–1.24)
GFR, Estimated: >60 mL/min (ref >60)
Glucose, Bld: 106 mg/dL — ABNORMAL HIGH (ref 70–99)
Potassium: 3.8 mmol/L (ref 3.5–5.1)
Sodium: 141 mmol/L (ref 135–145)
Total Bilirubin: 1 mg/dL (ref 0.3–1.2)
Total Protein: 8 g/dL (ref 6.5–8.1)

## 2021-06-14 LAB — RESP PANEL BY RT-PCR (FLU A&B, COVID) ARPGX2
Influenza A by PCR: NEGATIVE
Influenza B by PCR: NEGATIVE
SARS Coronavirus 2 by RT PCR: NEGATIVE

## 2021-06-14 LAB — CBC WITH DIFFERENTIAL/PLATELET
Abs Immature Granulocytes: 0.07 10*3/uL (ref 0.00–0.07)
Basophils Absolute: 0.1 10*3/uL (ref 0.0–0.1)
Basophils Relative: 0 %
Eosinophils Absolute: 0.3 10*3/uL (ref 0.0–0.5)
Eosinophils Relative: 2 %
HCT: 43.7 % (ref 39.0–52.0)
Hemoglobin: 14.1 g/dL (ref 13.0–17.0)
Immature Granulocytes: 1 %
Lymphocytes Relative: 15 %
Lymphs Abs: 2 10*3/uL (ref 0.7–4.0)
MCH: 28.5 pg (ref 26.0–34.0)
MCHC: 32.3 g/dL (ref 30.0–36.0)
MCV: 88.3 fL (ref 80.0–100.0)
Monocytes Absolute: 1.5 10*3/uL — ABNORMAL HIGH (ref 0.1–1.0)
Monocytes Relative: 11 %
Neutro Abs: 9.9 10*3/uL — ABNORMAL HIGH (ref 1.7–7.7)
Neutrophils Relative %: 71 %
Platelets: 348 10*3/uL (ref 150–400)
RBC: 4.95 MIL/uL (ref 4.22–5.81)
RDW: 13.3 % (ref 11.5–15.5)
WBC: 13.9 10*3/uL — ABNORMAL HIGH (ref 4.0–10.5)
nRBC: 0 % (ref 0.0–0.2)

## 2021-06-14 LAB — MONONUCLEOSIS SCREEN: Mono Screen: NEGATIVE

## 2021-06-14 LAB — LACTIC ACID, PLASMA: Lactic Acid, Venous: 0.7 mmol/L (ref 0.5–1.9)

## 2021-06-14 LAB — GROUP A STREP BY PCR: Group A Strep by PCR: DETECTED — AB

## 2021-06-14 MED ORDER — DEXAMETHASONE SODIUM PHOSPHATE 10 MG/ML IJ SOLN
10.0000 mg | Freq: Once | INTRAMUSCULAR | Status: AC
  Administered 2021-06-14: 10 mg via INTRAVENOUS
  Filled 2021-06-14: qty 1

## 2021-06-14 MED ORDER — SODIUM CHLORIDE 0.9 % IV SOLN
2.0000 g | INTRAVENOUS | Status: DC
  Administered 2021-06-14: 2 g via INTRAVENOUS
  Filled 2021-06-14: qty 20

## 2021-06-14 MED ORDER — METRONIDAZOLE 500 MG/100ML IV SOLN
500.0000 mg | Freq: Two times a day (BID) | INTRAVENOUS | Status: DC
  Administered 2021-06-14: 500 mg via INTRAVENOUS
  Filled 2021-06-14: qty 100

## 2021-06-14 MED ORDER — LIDOCAINE HCL (PF) 1 % IJ SOLN: 30.0000 mL | Freq: Once | INTRAMUSCULAR | Status: DC

## 2021-06-14 MED ORDER — IOHEXOL 300 MG/ML  SOLN
100.0000 mL | Freq: Once | INTRAMUSCULAR | Status: AC | PRN
  Administered 2021-06-14: 75 mL via INTRAVENOUS

## 2021-06-14 MED ORDER — CLINDAMYCIN HCL 150 MG PO CAPS: 300.0000 mg | ORAL_CAPSULE | Freq: Four times a day (QID) | ORAL | 0 refills | Status: AC

## 2021-06-14 MED ORDER — SODIUM CHLORIDE 0.9 % IV BOLUS
1000.0000 mL | Freq: Once | INTRAVENOUS | Status: AC
  Administered 2021-06-14: 1000 mL via INTRAVENOUS

## 2021-06-14 MED ORDER — METHYLPREDNISOLONE 4 MG PO TBPK: ORAL_TABLET | ORAL | 0 refills | Status: AC

## 2021-06-14 NOTE — ED Triage Notes (Addendum)
Sore throat, nasal congestion since Monday. Not wanting to eat due to pain.  On assessment, unable to visualize posterior throat due to very large tonsillar swelling that extends into the soft pallet. Notifying PA

## 2021-06-14 NOTE — Discharge Instructions (Addendum)
You were seen in the Emergency Department. You tested positive for strep throat and have a small developing abscess of your left tonsil. I consulted our ENT department and they think that it will likely respond to antibiotics and steroids. We have given you your initial doses here, but please pick up the antibiotic and steroid dose pack at your pharmacy. Please call Dr. Marene Lenz on Monday to schedule a follow up visit.

## 2021-06-14 NOTE — ED Provider Notes (Signed)
[facility]-EMERGENCY DEPT Provider Note   CSN: [id number] Arrival date & time: 06/14/21  1402     History PMH asthma No chief complaint on file.   Louis Smith is a 26 y.o. male. Presents with sore throat and nasal congestion for 5 days now.  He says the symptoms are gradually worsened.  He is having difficulty swallowing and has noticed voice changes.  He is painful when he opens and closes his mouth.  He has had subjective fevers at home.  He was seen by urgent care today who referred him to the emergency department for evaluation of peritonsillar abscess. He has no history of peritonsillar abscess.  Nobody else has been sick around him.  HPI     Home Medications Prior to Admission medications   Medication Sig Start Date End Date Taking? Authorizing Provider  clindamycin (CLEOCIN) 150 MG capsule Take 2 capsules (300 mg total) by mouth every 6 (six) hours for 10 days. 06/14/21 06/24/21 Yes Muna Demers, Finis Bud, PA-C  methylPREDNISolone (MEDROL DOSEPAK) 4 MG TBPK tablet Take 6 tablets (24 mg total) by mouth daily for 1 day, THEN 5 tablets (20 mg total) daily for 1 day, THEN 4 tablets (16 mg total) daily for 1 day, THEN 3 tablets (12 mg total) daily for 1 day, THEN 2 tablets (8 mg total) daily for 1 day, THEN 1 tablet (4 mg total) daily for 1 day. 06/14/21 06/20/21 Yes Baillie Mohammad, Finis Bud, PA-C  acetaminophen (TYLENOL) 325 MG tablet Take 650 mg by mouth every 6 (six) hours as needed for mild pain.    [provider]  fluticasone (FLONASE) 50 MCG/ACT nasal spray Place 1-2 sprays into both nostrils daily for 7 days. 09/29/18 10/06/18  Wieters, Hallie C, PA-C      Allergies    Patient has no known allergies.    Review of Systems   Review of Systems  Constitutional:  Positive for chills. Negative for fever.  HENT:  Positive for congestion, sore throat, trouble swallowing and voice change.   All other systems reviewed and are negative.   Physical Exam Updated  Vital Signs BP (!) 164/90 (BP Location: Left Arm)   Pulse (!) 110   Temp 98.6 F (37 C) (Oral)   Resp 18   Ht 6\' 3"  (1.905 m)   Wt 118.8 kg   SpO2 96%   BMI 32.75 kg/m  Physical Exam Vitals and nursing note reviewed.  Constitutional:      General: He is not in acute distress.    Appearance: Normal appearance. He is well-developed. He is not ill-appearing, toxic-appearing or diaphoretic.  HENT:     Head: Normocephalic and atraumatic.     Nose: Nose normal. No nasal deformity, congestion or rhinorrhea.     Mouth/Throat:     Lips: Pink. No lesions.     Mouth: Mucous membranes are moist.     Tonsils: Tonsillar exudate present. 2+ on the right. 4+ on the left.     Comments: Asymmetrical tonsillar swelling with more prominence on the left.  Exudates are noted.  Difficult to assess posterior pharynx given profound swelling of tonsils.  Mild trismus noted. Hot Potato Voice present. Eyes:     General: Gaze aligned appropriately. No scleral icterus.       Right eye: No discharge.        Left eye: No discharge.     Conjunctiva/sclera: Conjunctivae normal.     Right eye: Right conjunctiva is not injected. No exudate  or hemorrhage.    Left eye: Left conjunctiva is not injected. No exudate or hemorrhage. Neck:      Comments: Tenderness along the left anterior lymph chain. Lymphadenopathy more profound on left side.  Pulmonary:     Effort: Pulmonary effort is normal. No respiratory distress.  Skin:    General: Skin is warm and dry.  Neurological:     Mental Status: He is alert and oriented to person, place, and time.  Psychiatric:        Mood and Affect: Mood normal.        Speech: Speech normal.        Behavior: Behavior normal. Behavior is cooperative.     ED Results / Procedures / Treatments   Labs (all labs ordered are listed, but only abnormal results are displayed) Labs Reviewed  GROUP A STREP BY PCR - Abnormal; Notable for the following components:      Result Value    Group A Strep by PCR DETECTED (*)    All other components within normal limits  CBC WITH DIFFERENTIAL/PLATELET - Abnormal; Notable for the following components:   WBC 13.9 (*)    Neutro Abs 9.9 (*)    Monocytes Absolute 1.5 (*)    All other components within normal limits  COMPREHENSIVE METABOLIC PANEL - Abnormal; Notable for the following components:   Glucose, Bld 106 (*)    All other components within normal limits  RESP PANEL BY RT-PCR (FLU A&B, COVID) ARPGX2  LACTIC ACID, PLASMA  MONONUCLEOSIS SCREEN    EKG None  Radiology CT Soft Tissue Neck W Contrast  Result Date: 06/14/2021 CLINICAL DATA:  Peritonsillar abscess. Sore throat and nasal congestion. EXAM: CT NECK WITH CONTRAST TECHNIQUE: Multidetector CT imaging of the neck was performed using the standard protocol following the bolus administration of intravenous contrast. RADIATION DOSE REDUCTION: This exam was performed according to the departmental dose-optimization program which includes automated exposure control, adjustment of the mA and/or kV according to patient size and/or use of iterative reconstruction technique. CONTRAST:  49mL OMNIPAQUE IOHEXOL 300 MG/ML  SOLN COMPARISON:  CT head and cervical spine 07/26/2013 FINDINGS: Pharynx and larynx: There is asymmetric, prominence enlargement of the left palatine tonsil which demonstrates somewhat heterogeneous enhancement. A more focal 1.8 cm area of hypoenhancement is present along the inferolateral aspect of the tonsil and could reflect a developing abscess (series 2, image 46 and series 6, image 56). There is less pronounced right palatine tonsillar enlargement, and there is also prominent symmetric enlargement of the nasopharyngeal/adenoidal soft tissues. Tonsillar enlargement results in complete effacement of the nasopharyngeal airway and moderate narrowing of the upper oropharyngeal airway. The soft palate also appears diffusely thickened. Secretions are present in the nasal  cavity. There is no retropharyngeal fluid collection. Mild low-density edema extends into the inferior aspect of the oropharynx on the left. There is no significant epiglottic swelling. Salivary glands: No inflammation, mass, or stone. Thyroid: Unremarkable. Lymph nodes: Mild bilateral cervical lymphadenopathy with level II a lymph nodes measuring up to 1.4 cm in short axis on the right and 1.7 cm on the left, likely reactive. Vascular: Major vascular structures of the neck are grossly patent. Limited intracranial: Unremarkable. Visualized orbits: Unremarkable. Mastoids and visualized paranasal sinuses: Mild mucosal thickening in the included paranasal sinuses. Clear mastoid air cells. Skeleton: No acute osseous abnormality or suspicious osseous lesion. Upper chest: Clear lung apices. Other: None. IMPRESSION: 1. Acute tonsillitis with possible 1.8 cm developing peritonsillar abscess on the left. 2. Mild,  likely reactive cervical lymphadenopathy. Electronically Signed   By: Sebastian Ache M.D.   On: 06/14/2021 17:00    Procedures Procedures   Medications Ordered in ED Medications  cefTRIAXone (ROCEPHIN) 2 g in sodium chloride 0.9 % 100 mL IVPB (2 g Intravenous New Bag/Given 06/14/21 1736)    And  metroNIDAZOLE (FLAGYL) IVPB 500 mg (has no administration in time range)  iohexol (OMNIPAQUE) 300 MG/ML solution 100 mL (75 mLs Intravenous Contrast Given 06/14/21 1642)  dexamethasone (DECADRON) injection 10 mg (10 mg Intravenous Given 06/14/21 1731)  sodium chloride 0.9 % bolus 1,000 mL (1,000 mLs Intravenous New Bag/Given 06/14/21 1736)    ED Course/ Medical Decision Making/ A&P                            Medical Decision Making Amount and/or Complexity of Data Reviewed Labs: ordered. Radiology: ordered.  Risk Prescription drug management.    MDM  This is a 26 y.o. male who presents to the ED with sore throat The differential of this patient includes but is not limited to Epiglottitis,  Gonorrhea/Chlamydia, Ludwig's Angina, Mononucleosis, Peritonsillar Abscess, Retropharyngeal Abscess, Strep throat, and Viral Pharyngitis  My Impression, Plan, and ED Course: Patient presents with tachycardia to 110.  No fever here.  Hemodynamically stable otherwise. Exam concerning for left tonsillar swelling.  Difficult to assess posterior pharynx due to profound swelling as well as left-sided anterior chain lymphadenopathy.  He was sent here with concern for PTA. I agree that exam concerning for PTA. CT soft tissue neck ordered.  I personally ordered, reviewed, and interpreted all laboratory work and imaging and agree with radiologist interpretation. Results interpreted below: WBC 13.9, CMP unremarkable. COVID/Flu negative. Lactate normal. Mono negative. Strep A positive.  CT neck: reveals acute tonsillitis with developing 1.8 cm PTA on left with mild reactive lymphadenopathy   , I have consulted Dr. Vella Redhead from ENT and discussed this case.  She thinks that there is likely not enough of a pocket to drain at this time.  Recommends IV antibiotic doses of clinda equivalent here as well as 10 mg of IV Decadron.  Recommends oral clindamycin for 10 days at discharge as well as a Medrol Dosepak.  Recommend follow-up in ENT office for further evaluation on Monday.  On reassessment, patient remained stable and is tolerating secretions.  He has tolerated p.o.  I discussed plan with him and he agrees.  He will finish his antibiotics and be discharged with prescription sent to his pharmacy.  I discussed return precautions with him.  Charting Requirements Additional history is obtained from:  Independent historian External Records from outside source obtained and reviewed including: Reviewed UC note Social Determinants of Health:  Access to medical care Pertinant PMH that complicates patient's illness: n/a  Patient Care Problems that were addressed during this visit: - Peritonsillar Abscess: Acute  illness with complication - Strep pharyngitis: Acute illness with complication This patient was maintained on a cardiac monitor/telemetry. I personally viewed and interpreted the cardiac monitor which reveals an underlying rhythm of ST,NSR Medications given in ED: Decadron, IVF, Rocephin, Flagyl Reevaluation of the patient after these medicines showed that the patient improved I have reviewed home medications and made changes accordingly. Sent abx and steroids to pharmacy Consultations: ENT Disposition: Discharge  Portions of this note were generated with Dragon dictation software. Dictation errors may occur despite best attempts at proofreading.    Final Clinical Impression(s) / ED Diagnoses Final diagnoses:  Peritonsillar abscess  Strep pharyngitis    Rx / DC Orders ED Discharge Orders          Ordered    clindamycin (CLEOCIN) 150 MG capsule  Every 6 hours        06/14/21 1751    methylPREDNISolone (MEDROL DOSEPAK) 4 MG TBPK tablet  Daily        06/14/21 1751              Collen Vincent, Finis BudGrace C, PA-C 06/14/21 1810    Arby BarrettePfeiffer, Marcy, MD 06/15/21 [phone]

## 2021-06-14 NOTE — ED Notes (Signed)
Patient is being discharged from the Urgent Care and sent to the Emergency Department via POV . Per Ellsworth Lennox PA, patient is in need of higher level of care due to rule out peritonsillar abscess/tonsillar swelling. Patient is aware and verbalizes understanding of plan of care.  Vitals:   06/14/21 1331  BP: (!) 140/97  Pulse: (!) 115  Resp: 18  Temp: 99.4 F (37.4 C)  SpO2: 95%

## 2021-06-14 NOTE — ED Triage Notes (Signed)
Patient states he has been sick since Monday night and went to Urgent Care prior to coming to the ER today. States that his throat is swollen. States he has a history of allergies and asthma. Congested.

## 2021-06-15 NOTE — ED Provider Notes (Signed)
Patient referred to ER for evaluation of acute peritonsillar abcess. Ileana Ladd, PA-C 06/15/21 [phone]

## 2021-10-18 ENCOUNTER — Ambulatory Visit
Admission: EM | Admit: 2021-10-18 | Discharge: 2021-10-18 | Disposition: A | Discharge status: Home / Self Care | Payer: Self-pay | Attending: Urgent Care | Admitting: Urgent Care

## 2021-10-18 ENCOUNTER — Ambulatory Visit: Payer: Self-pay

## 2021-10-18 ENCOUNTER — Encounter: Payer: Self-pay | Admitting: *Deleted

## 2021-10-18 DIAGNOSIS — J0110 Acute frontal sinusitis, unspecified: Secondary | ICD-10-CM

## 2021-10-18 MED ORDER — IBUPROFEN 600 MG PO TABS: 600.0000 mg | ORAL_TABLET | Freq: Four times a day (QID) | ORAL | 0 refills | Status: AC | PRN

## 2021-10-18 MED ORDER — IBUPROFEN 800 MG PO TABS
800.0000 mg | ORAL_TABLET | Freq: Once | ORAL | Status: AC
  Administered 2021-10-18: 800 mg via ORAL

## 2021-10-18 MED ORDER — AMOXICILLIN-POT CLAVULANATE 875-125 MG PO TABS: 1.0000 | ORAL_TABLET | Freq: Two times a day (BID) | ORAL | 0 refills | Status: AC

## 2021-10-18 NOTE — ED Triage Notes (Signed)
C/O runny nose, congestion, cough, sinus pressure over past 1-2 wks. States cough has mostly resolved, but c/o significant facial pain and pressure, much worse when he bends over. Has been taking Tylenol 650mg , muscle relaxer, and OTC meds without relief.

## 2021-10-18 NOTE — Discharge Instructions (Addendum)
You have a sinus infection. Please start taking the antibiotic, Augmentin, twice daily with food. Take it for all 10 days, do not stop early just because you feel better. Take an over the counter probiotic or yogurt daily to help prevent diarrhea/ yeast infection. Use OTC Flonase daily to help with inflammation of the nasal passage. It is also recommended that you use nasal saline/ sinus washes to cleans the sinus passages. Hot steam from a shower or vaporizer may also be beneficial to help open up the upper airway. Eucalyptus can be helpful. Take the ibuprofen every 6-8 hours with food to help with headache and fever. If any worsening symptoms such as worsening headache, uncontrolled fever, pain in the eyes when looking at light, or stiffness of the neck, please go to the ER.

## 2021-10-18 NOTE — ED Provider Notes (Signed)
[facility]    CSN: [id number] Arrival date & time: 10/18/21  1416      History   Chief Complaint Chief Complaint  Patient presents with  . Headache  . Cough    HPI [name] is a 26 y.o. male.   26 year old male presents today due to concerns of sinusitis.  States he is prone to sinus infections, usually around allergy season.  For the past 2 weeks, has been having sinus pain and pressure.  Previously reported significant nasal drainage.  Since Monday however, has been having intermittent frontal sinus pain and fevers.  His daughter recently had COVID, but patient took several tests and was negative.   Headache Associated symptoms: cough   Cough Associated symptoms: headaches     Past Medical History:  Diagnosis Date  . Asthma     There are no problems to display for this patient.   Past Surgical History:  Procedure Laterality Date  . FACIAL COSMETIC SURGERY         Home Medications    Prior to Admission medications   Medication Sig Start Date End Date Taking? Authorizing Provider  acetaminophen (TYLENOL) 325 MG tablet Take 650 mg by mouth every 6 (six) hours as needed for mild pain.   Yes [provider]  amoxicillin-clavulanate (AUGMENTIN) 875-125 MG tablet Take 1 tablet by mouth every 12 (twelve) hours for 10 days. 10/18/21 10/28/21 Yes Skylan Gift L, PA  ibuprofen (ADVIL) 600 MG tablet Take 1 tablet (600 mg total) by mouth every 6 (six) hours as needed for headache. With food 10/18/21  Yes Macenzie Burford L, PA  fluticasone (FLONASE) 50 MCG/ACT nasal spray Place 1-2 sprays into both nostrils daily for 7 days. 09/29/18 10/06/18  Wieters, Junius Creamer, PA-C    Family History Family History  Problem Relation Age of Onset  . Healthy Mother   . Healthy Father     Social History Social History   Tobacco Use  . Smoking status: Every Day    Types: Cigars  . Smokeless tobacco: Never  . Tobacco comments:    1 black and mild per day   Vaping Use  . Vaping Use: Never used  Substance Use Topics  . Alcohol use: Yes    Comment: socially  . Drug use: Yes    Types: Marijuana     Allergies   Patient has no known allergies.   Review of Systems Review of Systems  Respiratory:  Positive for cough.   Neurological:  Positive for headaches.     Physical Exam Triage Vital Signs ED Triage Vitals  Enc Vitals Group     BP 10/18/21 1427 (!) 141/88     Pulse Rate 10/18/21 1427 (!) 112     Resp 10/18/21 1427 (!) 24     Temp 10/18/21 1427 (!) 100.6 F (38.1 C)     Temp Source 10/18/21 1427 Oral     SpO2 10/18/21 1427 96 %     Weight --      Height --      Head Circumference --      Peak Flow --      Pain Score 10/18/21 1500 7     Pain Loc --      Pain Edu? --      Excl. in GC? --    No data found.  Updated Vital Signs BP (!) 141/88 (BP Location: Right Arm)   Pulse (!) 112   Temp (!) 100.6 F (38.1  C) (Oral)   Resp (!) 24   SpO2 96%   Visual Acuity Right Eye Distance:   Left Eye Distance:   Bilateral Distance:    Right Eye Near:   Left Eye Near:    Bilateral Near:     Physical Exam   UC Treatments / Results  Labs (all labs ordered are listed, but only abnormal results are displayed) Labs Reviewed - No data to display  EKG   Radiology No results found.  Procedures Procedures (including critical care time)  Medications Ordered in UC Medications  ibuprofen (ADVIL) tablet 800 mg (800 mg Oral Given 10/18/21 1504)    Initial Impression / Assessment and Plan / UC Course  I have reviewed the triage vital signs and the nursing notes.  Pertinent labs & imaging results that were available during my care of the patient were reviewed by me and considered in my medical decision making (see chart for details).     *** Final Clinical Impressions(s) / UC Diagnoses   Final diagnoses:  Acute non-recurrent frontal sinusitis     Discharge Instructions      You have a sinus  infection. Please start taking the antibiotic, Augmentin, twice daily with food. Take it for all 10 days, do not stop early just because you feel better. Take an over the counter probiotic or yogurt daily to help prevent diarrhea/ yeast infection. Use OTC Flonase daily to help with inflammation of the nasal passage. It is also recommended that you use nasal saline/ sinus washes to cleans the sinus passages. Hot steam from a shower or vaporizer may also be beneficial to help open up the upper airway. Eucalyptus can be helpful. Take the ibuprofen every 6-8 hours with food to help with headache and fever. If any worsening symptoms such as worsening headache, uncontrolled fever, pain in the eyes when looking at light, or stiffness of the neck, please go to the ER.   ED Prescriptions     Medication Sig Dispense Auth. Provider   ibuprofen (ADVIL) 600 MG tablet Take 1 tablet (600 mg total) by mouth every 6 (six) hours as needed for headache. With food 30 tablet Lichelle Viets L, PA   amoxicillin-clavulanate (AUGMENTIN) 875-125 MG tablet Take 1 tablet by mouth every 12 (twelve) hours for 10 days. 20 tablet Kira Hartl L, [place]      PDMP not reviewed this encounter.

## 2022-09-14 IMAGING — CT CT NECK W/ CM
3 of 4 series · 10 of 33 positions shown, 12 images · IV contrast (agent unspecified)
Comparison: CT head and cervical spine 07/26/2013

CLINICAL DATA: Peritonsillar abscess. Sore throat and nasal
congestion.

EXAM:
CT NECK WITH CONTRAST
TECHNIQUE: Multidetector CT imaging of the neck was performed using the
standard protocol following the bolus administration of intravenous
contrast.

[Series 5: axial · axial · 0.35mm/px · z∈[+1559,+1699]mm · 2 of 173 slices shown, 3 images]
[im 50/173  soft-tissue]
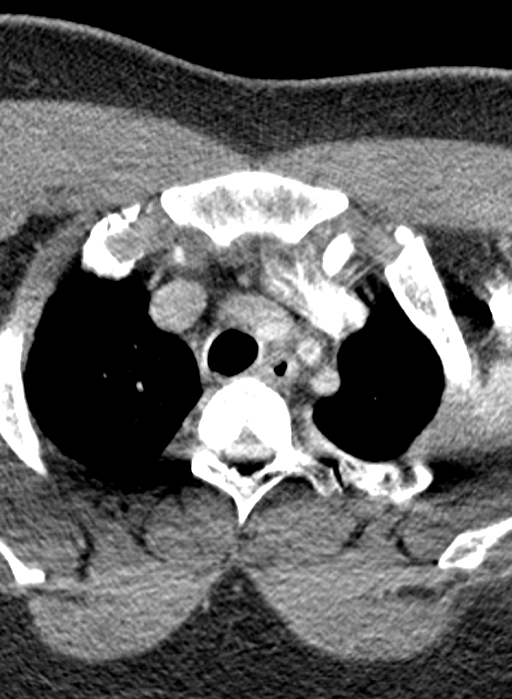
[im 50/173  bone]
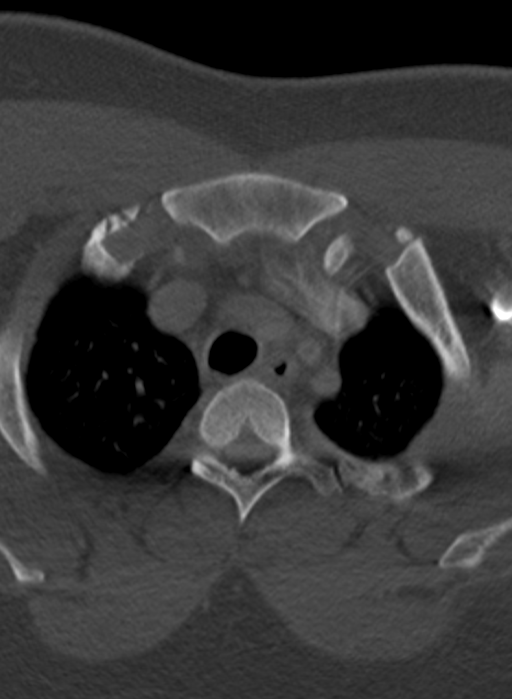
[im 123/173  bone]
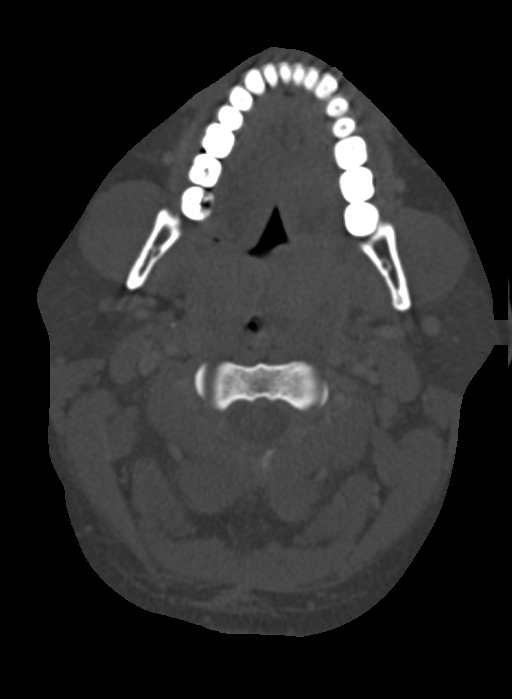

[Series 6: coronal · coronal · 0.35mm/px · 3 of 124 slices shown]
[im 35/124  bone]
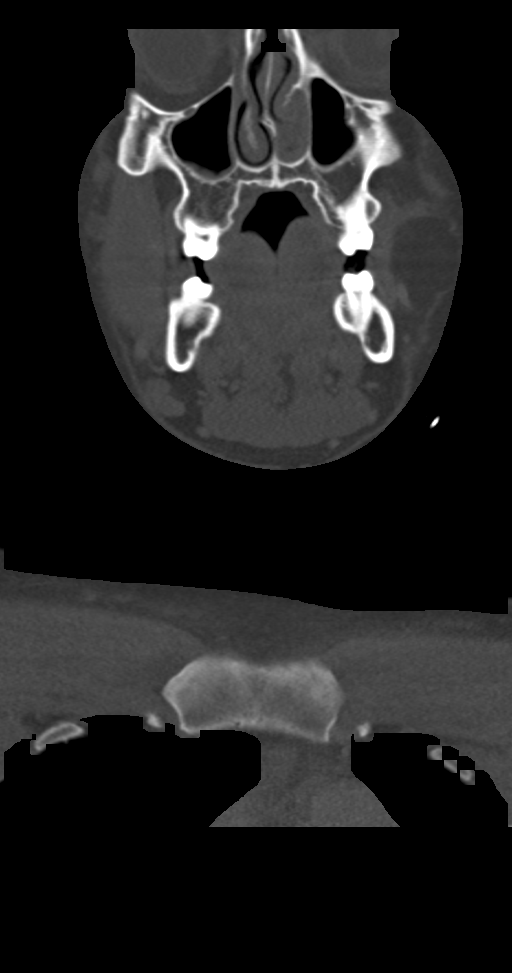
[im 53/124  bone]
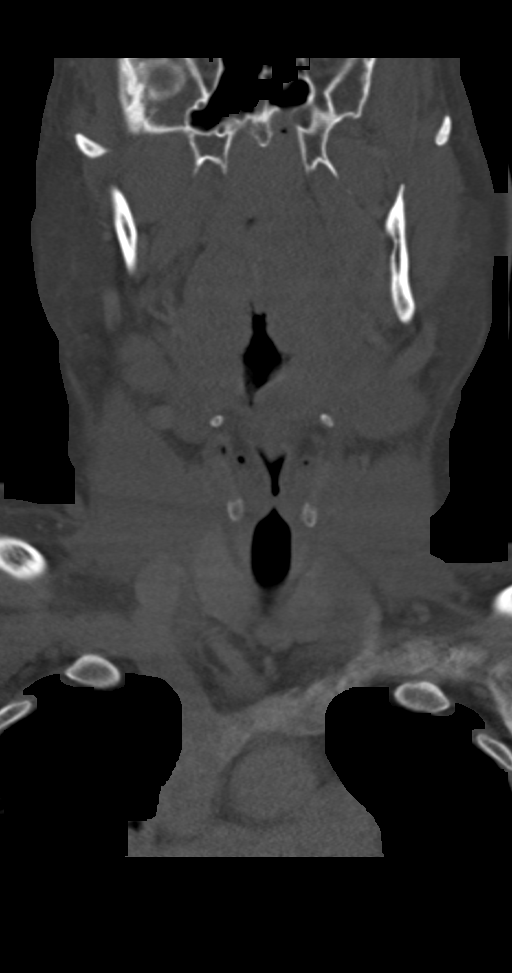
[im 71/124  bone]
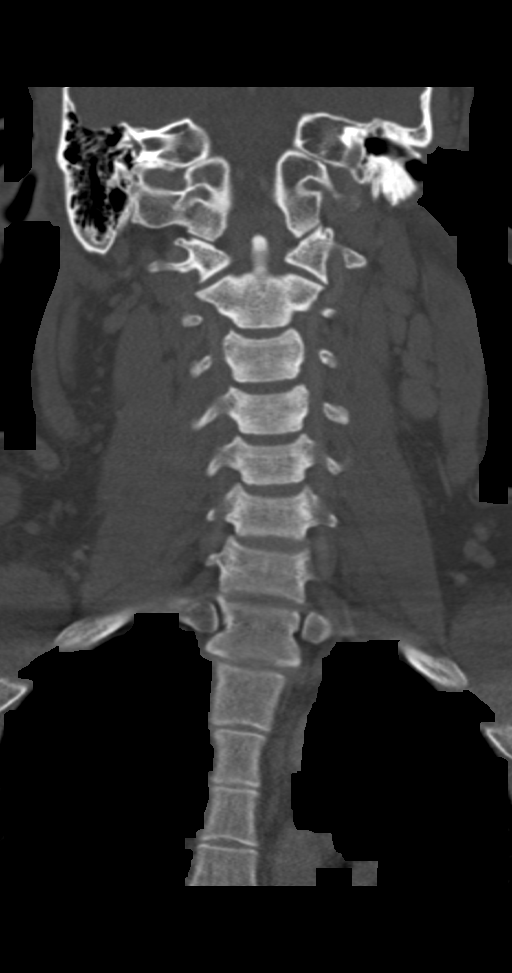

[Series 7: sagittal · sagittal · 0.48mm/px · 5 of 91 slices shown, 6 images]
[im 31/91  bone]
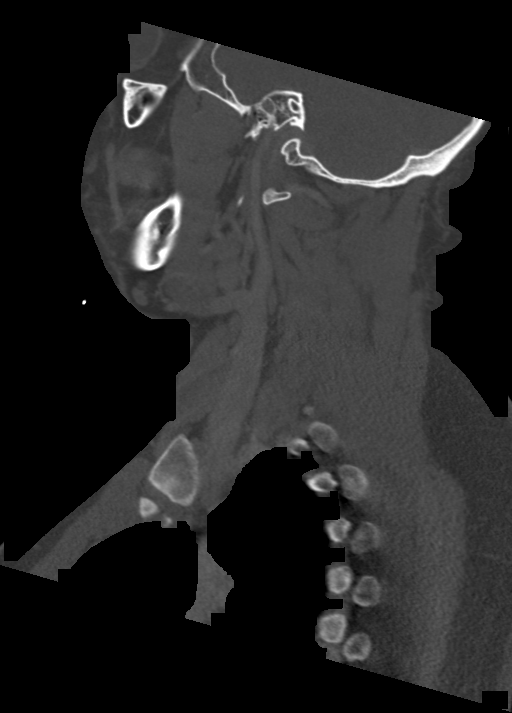
[im 38/91  bone]
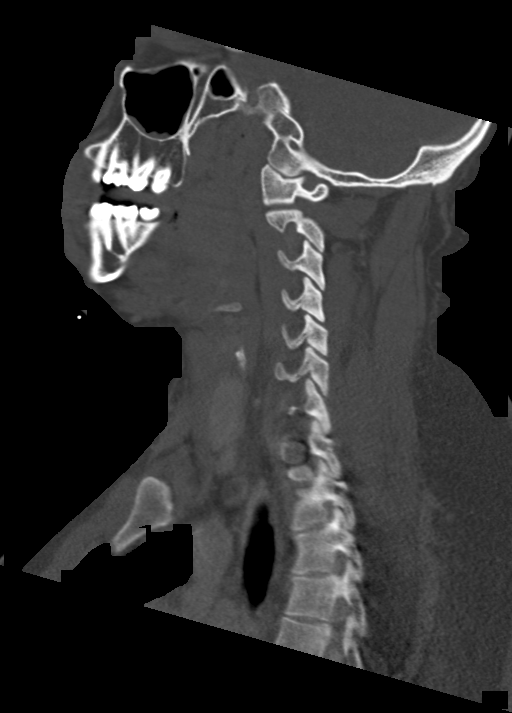
[im 46/91  soft-tissue]
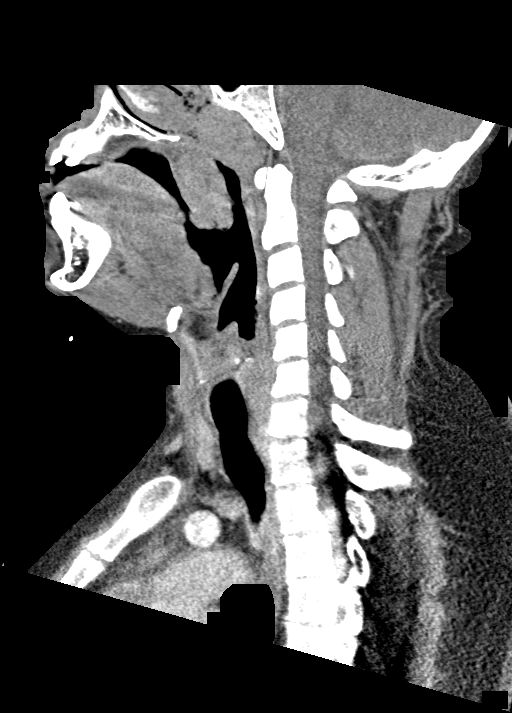
[im 46/91  bone]
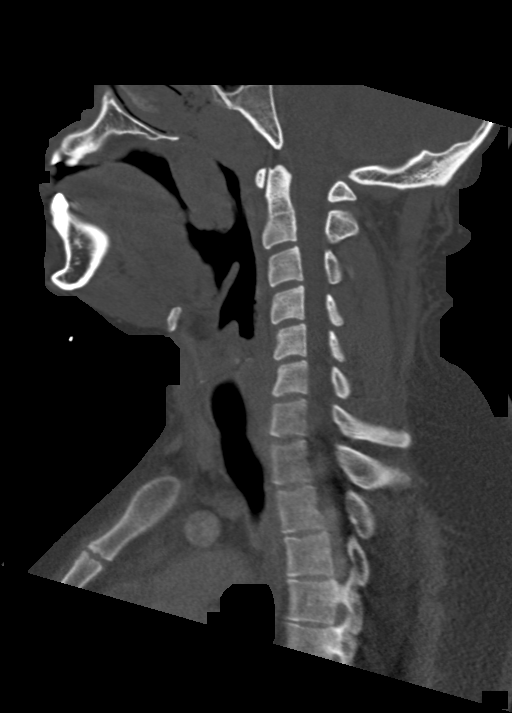
[im 53/91  bone]
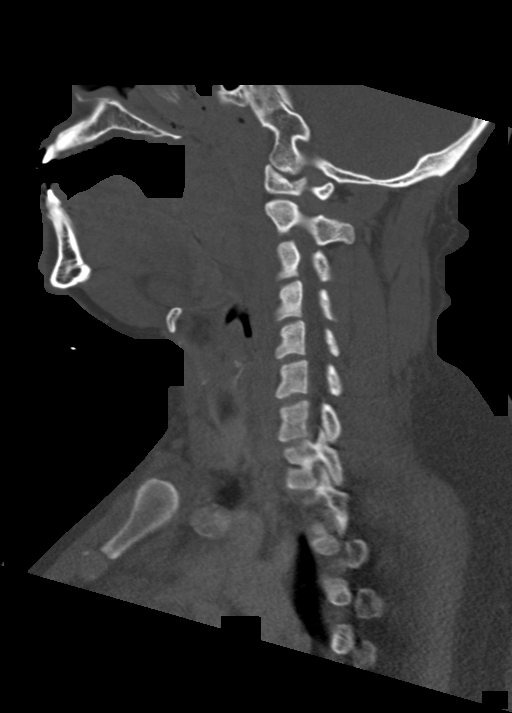
[im 61/91  bone]
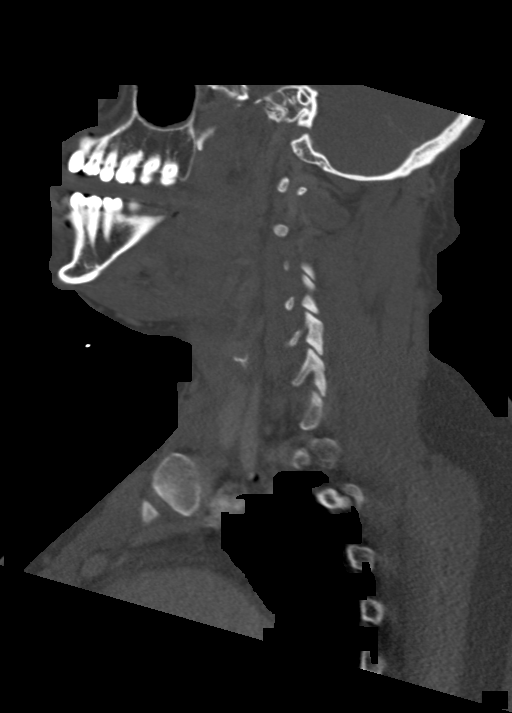

[10 of 33 positions shown; findings below may reference images not displayed]

RADIATION DOSE REDUCTION: This exam was performed according to the
departmental dose-optimization program which includes automated
exposure control, adjustment of the mA and/or kV according to
patient size and/or use of iterative reconstruction technique.

CONTRAST:  75mL OMNIPAQUE IOHEXOL 300 MG/ML  SOLN
FINDINGS: Pharynx and larynx: There is asymmetric, prominence enlargement of
the left palatine tonsil which demonstrates somewhat heterogeneous
enhancement. A more focal 1.8 cm area of hypoenhancement is present
along the inferolateral aspect of the tonsil and could reflect a
developing abscess (series 2, image 46 and series 6, image 56).
There is less pronounced right palatine tonsillar enlargement, and
there is also prominent symmetric enlargement of the
nasopharyngeal/adenoidal soft tissues. Tonsillar enlargement results
in complete effacement of the nasopharyngeal airway and moderate
narrowing of the upper oropharyngeal airway. The soft palate also
appears diffusely thickened. Secretions are present in the nasal
cavity. There is no retropharyngeal fluid collection. Mild
low-density edema extends into the inferior aspect of the oropharynx
on the left. There is no significant epiglottic swelling.

Salivary glands: No inflammation, mass, or stone.

Thyroid: Unremarkable.

Lymph nodes: Mild bilateral cervical lymphadenopathy with level II a
lymph nodes measuring up to 1.4 cm in short axis on the right and
1.7 cm on the left, likely reactive.

Vascular: Major vascular structures of the neck are grossly patent.

Limited intracranial: Unremarkable.

Visualized orbits: Unremarkable.

Mastoids and visualized paranasal sinuses: Mild mucosal thickening
in the included paranasal sinuses. Clear mastoid air cells.

Skeleton: No acute osseous abnormality or suspicious osseous lesion.

Upper chest: Clear lung apices.

Other: None.
IMPRESSION: 1. Acute tonsillitis with possible 1.8 cm developing peritonsillar
abscess on the left.
2. Mild, likely reactive cervical lymphadenopathy.

## 2023-08-01 ENCOUNTER — Encounter (HOSPITAL_COMMUNITY): Payer: Self-pay

## 2023-08-01 ENCOUNTER — Ambulatory Visit (HOSPITAL_COMMUNITY)
Admission: EM | Admit: 2023-08-01 | Discharge: 2023-08-01 | Disposition: A | Discharge status: Home / Self Care | Payer: Self-pay | Attending: Internal Medicine | Admitting: Internal Medicine

## 2023-08-01 DIAGNOSIS — A630 Anogenital (venereal) warts: Secondary | ICD-10-CM

## 2023-08-01 MED ORDER — IMIQUIMOD 5 % EX CREA: TOPICAL_CREAM | CUTANEOUS | 1 refills | Status: AC

## 2023-08-01 NOTE — ED Provider Notes (Signed)
 [facility]    CSN: [id number] Arrival date & time: 08/01/23  1507      History   Chief Complaint Chief Complaint  Patient presents with   Rash    HPI Louis Smith is a 28 y.o. male.   28 year old male who presents urgent care with complaints of a growth around his right groin.  This started about 4 months ago.  He reports it does seem to be spreading.  He is not having any pain, dysuria, hematuria, penile discharge and no concerns for sexually transmitted disease at this time otherwise.  He denies any fevers, chills, abdominal pain.  He has not tried any treatment as of yet.  He does not currently have a primary care physician.   Rash Associated symptoms: no abdominal pain, no fever, no joint pain, no shortness of breath, no sore throat and not vomiting     Past Medical History:  Diagnosis Date   Asthma     There are no active problems to display for this patient.   Past Surgical History:  Procedure Laterality Date   FACIAL COSMETIC SURGERY         Home Medications    Prior to Admission medications   Medication Sig Start Date End Date Taking? Authorizing Provider  imiquimod  (ALDARA ) 5 % cream Apply topically 3 (three) times a week. Apply a small amount nightly to the area and cover with a dressing 3 times weekly. Wash off with soap and water first thing in AM. Apply for 3 months 08/02/23  Yes Laraya Pestka A, PA-C  acetaminophen  (TYLENOL ) 325 MG tablet Take 650 mg by mouth every 6 (six) hours as needed for mild pain.    [provider]  fluticasone  (FLONASE ) 50 MCG/ACT nasal spray Place 1-2 sprays into both nostrils daily for 7 days. 09/29/18 10/06/18  Wieters, Hallie C, PA-C  ibuprofen  (ADVIL ) 600 MG tablet Take 1 tablet (600 mg total) by mouth every 6 (six) hours as needed for headache. With food 10/18/21   Lowella Benton CROME, PA    Family History Family History  Problem Relation Age of Onset   Healthy Mother    Healthy Father      Social History Social History   Tobacco Use   Smoking status: Every Day    Types: Cigars   Smokeless tobacco: Never   Tobacco comments:    1 black and mild per day  Vaping Use   Vaping status: Never Used  Substance Use Topics   Alcohol use: Yes    Comment: socially   Drug use: Yes    Types: Marijuana     Allergies   Patient has no known allergies.   Review of Systems Review of Systems  Constitutional:  Negative for chills and fever.  HENT:  Negative for ear pain and sore throat.   Eyes:  Negative for pain and visual disturbance.  Respiratory:  Negative for cough and shortness of breath.   Cardiovascular:  Negative for chest pain and palpitations.  Gastrointestinal:  Negative for abdominal pain and vomiting.  Genitourinary:  Negative for dysuria and hematuria.  Musculoskeletal:  Negative for arthralgias and back pain.  Skin:  Positive for rash (right groin). Negative for color change.  Neurological:  Negative for seizures and syncope.  All other systems reviewed and are negative.    Physical Exam Triage Vital Signs ED Triage Vitals  Encounter Vitals Group     BP 08/01/23 1537 (!) 152/97     Girls  Systolic BP Percentile --      Girls Diastolic BP Percentile --      Boys Systolic BP Percentile --      Boys Diastolic BP Percentile --      Pulse Rate 08/01/23 1537 94     Resp 08/01/23 1537 18     Temp 08/01/23 1537 98.1 F (36.7 C)     Temp Source 08/01/23 1537 Oral     SpO2 08/01/23 1537 97 %     Weight 08/01/23 1535 262 lb (118.8 kg)     Height 08/01/23 1535 6' 3 (1.905 m)     Head Circumference --      Peak Flow --      Pain Score 08/01/23 1535 0     Pain Loc --      Pain Education --      Exclude from Growth Chart --    No data found.  Updated Vital Signs BP (!) 152/97 (BP Location: Right Arm)   Pulse 94   Temp 98.1 F (36.7 C) (Oral)   Resp 18   Ht 6' 3 (1.905 m)   Wt 262 lb (118.8 kg)   SpO2 97%   BMI 32.75 kg/m   Visual  Acuity Right Eye Distance:   Left Eye Distance:   Bilateral Distance:    Right Eye Near:   Left Eye Near:    Bilateral Near:     Physical Exam Vitals and nursing note reviewed.  Constitutional:      General: He is not in acute distress.    Appearance: He is well-developed.  HENT:     Head: Normocephalic and atraumatic.  Eyes:     Conjunctiva/sclera: Conjunctivae normal.  Cardiovascular:     Rate and Rhythm: Normal rate and regular rhythm.     Heart sounds: No murmur heard. Pulmonary:     Effort: Pulmonary effort is normal. No respiratory distress.     Breath sounds: Normal breath sounds.  Abdominal:     Palpations: Abdomen is soft.     Tenderness: There is no abdominal tenderness.  Musculoskeletal:        General: No swelling.     Cervical back: Neck supple.  Skin:    General: Skin is warm and dry.     Capillary Refill: Capillary refill takes less than 2 seconds.      Neurological:     Mental Status: He is alert.  Psychiatric:        Mood and Affect: Mood normal.      UC Treatments / Results  Labs (all labs ordered are listed, but only abnormal results are displayed) Labs Reviewed - No data to display  EKG   Radiology No results found.  Procedures Procedures (including critical care time)  Medications Ordered in UC Medications - No data to display  Initial Impression / Assessment and Plan / UC Course  I have reviewed the triage vital signs and the nursing notes.  Pertinent labs & imaging results that were available during my care of the patient were reviewed by me and considered in my medical decision making (see chart for details).     Condyloma acuminatum   The area along the right groin is consistent with condylomas (genital warts).   We can treat this with topical medication to remove the condylomas.  This can take several months.  They can recur and on occasion may need surgical procedure for excision.  As this is the first occurrence we will  treat with the topical medication first.  Will also help you establish with a primary care physician.  We will treat with the following: Imiquimod  (Aldara ) 5% cream: Apply topically to the affected area nightly and cover with a dry dressing.  Remove the dressing first thing in the morning and wash the area with soap and water.  Do this 3 times weekly.  May need to treat for up to 3 months. Keep appointment with primary care physician as scheduled If symptoms fail to resolve or worsen can return to urgent care for further evaluation  Final Clinical Impressions(s) / UC Diagnoses   Final diagnoses:  Condyloma acuminatum     Discharge Instructions      The area along the right groin is consistent with condylomas (genital warts).   We can treat this with topical medication to remove the condylomas.  This can take several months.  They can recur and on occasion may need surgical procedure for excision.  As this is the first occurrence we will treat with the topical medication first.  Will also help you establish with a primary care physician.  We will treat with the following: Imiquimod  (Aldara ) 5% cream: Apply topically to the affected area nightly and cover with a dry dressing.  Remove the dressing first thing in the morning and wash the area with soap and water.  Do this 3 times weekly.  May need to treat for up to 3 months. Keep appointment with primary care physician as scheduled If symptoms fail to resolve or worsen can return to urgent care for further evaluation    ED Prescriptions     Medication Sig Dispense Auth. Provider   imiquimod  (ALDARA ) 5 % cream Apply topically 3 (three) times a week. Apply a small amount nightly to the area and cover with a dressing 3 times weekly. Wash off with soap and water first thing in AM. Apply for 3 months 24 each Kaire Stary, Almarie LABOR, PA-C      PDMP not reviewed this encounter.   Teresa Almarie LABOR, [place] 08/01/23 1556

## 2023-08-01 NOTE — Discharge Instructions (Addendum)
 The area along the right groin is consistent with condylomas (genital warts).   We can treat this with topical medication to remove the condylomas.  This can take several months.  They can recur and on occasion may need surgical procedure for excision.  As this is the first occurrence we will treat with the topical medication first.  Will also help you establish with a primary care physician.  We will treat with the following: Imiquimod  (Aldara ) 5% cream: Apply topically to the affected area nightly and cover with a dry dressing.  Remove the dressing first thing in the morning and wash the area with soap and water.  Do this 3 times weekly.  May need to treat for up to 3 months. Keep appointment with primary care physician as scheduled If symptoms fail to resolve or worsen can return to urgent care for further evaluation

## 2023-08-01 NOTE — ED Triage Notes (Signed)
 Pt states that he has a rash on his penis x4 months

## 2023-08-02 ENCOUNTER — Ambulatory Visit (HOSPITAL_COMMUNITY): Payer: Self-pay
